# Patient Record
Sex: Female | Born: 1945 | Race: White | Hispanic: No | Marital: Married | Smoking: Never smoker
Health system: Southern US, Community
[De-identification: ages and names within clinical notes are randomized; demographics above are authoritative.]

## PROBLEM LIST (undated history)

## (undated) DIAGNOSIS — E7800 Pure hypercholesterolemia, unspecified: Secondary | ICD-10-CM

## (undated) DIAGNOSIS — E119 Type 2 diabetes mellitus without complications: Secondary | ICD-10-CM

## (undated) DIAGNOSIS — I10 Essential (primary) hypertension: Secondary | ICD-10-CM

## (undated) HISTORY — DX: Pure hypercholesterolemia, unspecified: E78.00

## (undated) HISTORY — PX: NO PAST SURGERIES: SHX2092

## (undated) HISTORY — DX: Essential (primary) hypertension: I10

## (undated) HISTORY — DX: Type 2 diabetes mellitus without complications: E11.9

## (undated) HISTORY — PX: ABDOMINAL HYSTERECTOMY: SHX81

---

## 2006-04-25 ENCOUNTER — Ambulatory Visit: Payer: Self-pay | Admitting: Internal Medicine

## 2006-05-09 ENCOUNTER — Ambulatory Visit: Payer: Self-pay | Admitting: Internal Medicine

## 2011-05-31 ENCOUNTER — Encounter: Payer: Self-pay | Admitting: Internal Medicine

## 2012-06-08 ENCOUNTER — Encounter: Payer: Self-pay | Admitting: Internal Medicine

## 2014-05-02 ENCOUNTER — Encounter: Payer: Self-pay | Admitting: Internal Medicine

## 2014-05-08 ENCOUNTER — Ambulatory Visit (AMBULATORY_SURGERY_CENTER): Payer: Medicare Other

## 2014-05-08 VITALS — Ht 60.0 in | Wt 153.8 lb

## 2014-05-08 DIAGNOSIS — Z800 Family history of malignant neoplasm of digestive organs: Secondary | ICD-10-CM

## 2014-05-08 MED ORDER — MOVIPREP 100 G PO SOLR: 1.0000 | Freq: Once | ORAL | Status: DC

## 2014-05-08 NOTE — Progress Notes (Signed)
No allergies to eggs or soy No diet/weight loss meds No home oxygen No past problems with anesthesia  Has email  Emmi instructions given for colonoscopy 

## 2014-05-22 ENCOUNTER — Ambulatory Visit (AMBULATORY_SURGERY_CENTER): Payer: Medicare Other | Admitting: Internal Medicine

## 2014-05-22 ENCOUNTER — Encounter: Payer: Self-pay | Admitting: Internal Medicine

## 2014-05-22 VITALS — BP 123/59 | HR 67 | Temp 96.4°F | Resp 16 | Ht 60.0 in | Wt 153.0 lb

## 2014-05-22 DIAGNOSIS — Z800 Family history of malignant neoplasm of digestive organs: Secondary | ICD-10-CM

## 2014-05-22 DIAGNOSIS — Z1211 Encounter for screening for malignant neoplasm of colon: Secondary | ICD-10-CM

## 2014-05-22 DIAGNOSIS — D126 Benign neoplasm of colon, unspecified: Secondary | ICD-10-CM

## 2014-05-22 DIAGNOSIS — Z86010 Personal history of colonic polyps: Secondary | ICD-10-CM

## 2014-05-22 MED ORDER — SODIUM CHLORIDE 0.9 % IV SOLN: 500.0000 mL | INTRAVENOUS | Status: DC

## 2014-05-22 NOTE — Progress Notes (Signed)
Called to room to assist during endoscopic procedure.  Patient ID and intended procedure confirmed with present staff. Received instructions for my participation in the procedure from the performing physician.  

## 2014-05-22 NOTE — Op Note (Signed)
[place]  [address]. [place], [id number]   COLONOSCOPY PROCEDURE REPORT  PATIENT: Linda Booker, Linda Booker  MR#: [id number] BIRTHDATE: 05/29/1946 , 20  yrs. old GENDER: Female ENDOSCOPIST: Lafayette Dragon, MD REFERRED JS:HFWY Virgina Jock, M.D. PROCEDURE DATE:  05/22/2014 PROCEDURE:   Colonoscopy with cold biopsy polypectomy First Screening Colonoscopy - Avg.  risk and is 50 yrs.  old or older - No.  Prior Negative Screening - Now for repeat screening. N/A  History of Adenoma - Now for follow-up colonoscopy & has been > or = to 3 yrs.  Yes hx of adenoma.  Has been 3 or more years since last colonoscopy.  Polyps Removed Today? Yes. ASA CLASS:   Class II INDICATIONS:Patient's immediate family history of colon cancer and prior colonoscopy in August 2007..  Tubular adenoma and hyperplastic polyp removed from sigmoid colon.  Father with colon cancer. MEDICATIONS: MAC sedation, administered by CRNA and Propofol (Diprivan) 270 mg IV  DESCRIPTION OF PROCEDURE:   After the risks benefits and alternatives of the procedure were thoroughly explained, informed consent was obtained.  A digital rectal exam revealed no abnormalities of the rectum.   The LB [id number]  endoscope was introduced through the anus and advanced to the cecum, which was identified by both the appendix and ileocecal valve. No adverse events experienced.   The quality of the prep was Moviprep fair The instrument was then slowly withdrawn as the colon was fully examined.      COLON FINDINGS: Two sessile polyps ranging between 3-17mm in size were found in the sigmoid colon and descending colon.  A polypectomy was performed with cold forceps.  The resection was complete and the polyp tissue was completely retrieved.   Mild diverticulosis was noted in the descending colon.  Retroflexed views revealed no abnormalities. The time to cecum=10 minutes 43 seconds.  Withdrawal time=8 minutes 59 seconds.  The  scope was withdrawn and the procedure completed. COMPLICATIONS: There were no complications.  ENDOSCOPIC IMPRESSION: 1.   Two sessile polyps ranging between 3-44mm in size were found in the sigmoid colon and descending colon; polypectomy was performed with cold forceps 2.   Mild diverticulosis was noted in the descending colon  RECOMMENDATIONS: 1.  Await pathology results 2.  high fiber diet Recall colonoscopy pending path report   eSigned:  Lafayette Dragon, MD 05/22/2014 11:54 AM   cc:   PATIENT NAME:  Linda Booker MR#: [id number]

## 2014-05-22 NOTE — Patient Instructions (Signed)
YOU HAD AN ENDOSCOPIC PROCEDURE TODAY AT THE [facility]: Refer to the procedure report that was given to you for any specific questions about what was found during the examination.  If the procedure report does not answer your questions, please call your gastroenterologist to clarify.  If you requested that your care partner not be given the details of your procedure findings, then the procedure report has been included in a sealed envelope for you to review at your convenience later.  YOU SHOULD EXPECT: Some feelings of bloating in the abdomen. Passage of more gas than usual.  Walking can help get rid of the air that was put into your GI tract during the procedure and reduce the bloating. If you had a lower endoscopy (such as a colonoscopy or flexible sigmoidoscopy) you may notice spotting of blood in your stool or on the toilet paper. If you underwent a bowel prep for your procedure, then you may not have a normal bowel movement for a few days.  DIET: Your first meal following the procedure should be a light meal and then it is ok to progress to your normal diet.  A half-sandwich or bowl of soup is an example of a good first meal.  Heavy or fried foods are harder to digest and may make you feel nauseous or bloated.  Likewise meals heavy in dairy and vegetables can cause extra gas to form and this can also increase the bloating.  Drink plenty of fluids but you should avoid alcoholic beverages for 24 hours.  ACTIVITY: Your care partner should take you home directly after the procedure.  You should plan to take it easy, moving slowly for the rest of the day.  You can resume normal activity the day after the procedure however you should NOT DRIVE or use heavy machinery for 24 hours (because of the sedation medicines used during the test).    SYMPTOMS TO REPORT IMMEDIATELY: A gastroenterologist can be reached at any hour.  During normal business hours, 8:30 AM to 5:00 PM Monday through Friday,  call [phone].  After hours and on weekends, please call the GI answering service at [phone] who will take a message and have the physician on call contact you.   Following lower endoscopy (colonoscopy or flexible sigmoidoscopy):  Excessive amounts of blood in the stool  Significant tenderness or worsening of abdominal pains  Swelling of the abdomen that is new, acute  Fever of 100F or higher  FOLLOW UP: If any biopsies were taken you will be contacted by phone or by letter within the next 1-3 weeks.  Call your gastroenterologist if you have not heard about the biopsies in 3 weeks.  Our staff will call the home number listed on your records the next business day following your procedure to check on you and address any questions or concerns that you may have at that time regarding the information given to you following your procedure. This is a courtesy call and so if there is no answer at the home number and we have not heard from you through the emergency physician on call, we will assume that you have returned to your regular daily activities without incident.  SIGNATURES/CONFIDENTIALITY: You and/or your care partner have signed paperwork which will be entered into your electronic medical record.  These signatures attest to the fact that that the information above on your After Visit Summary has been reviewed and is understood.  Full responsibility of the confidentiality of this   discharge information lies with you and/or your care-partner.  Polyps, diverticulosis, high fiber diet-handouts given  Repeat colonoscopy will be detemined by pathology.

## 2014-05-22 NOTE — Progress Notes (Signed)
Procedure ends, to recovery, report given and VSS. 

## 2014-05-23 ENCOUNTER — Telehealth: Payer: Self-pay | Admitting: *Deleted

## 2014-05-23 NOTE — Telephone Encounter (Signed)
  Follow up Call-  Call back number 05/22/2014  Post procedure Call Back phone  # [phone]  Permission to leave phone message Yes     Patient questions:  Do you have a fever, pain , or abdominal swelling? No. Pain Score  0 *  Have you tolerated food without any problems? Yes.    Have you been able to return to your normal activities? Yes.    Do you have any questions about your discharge instructions: Diet   No. Medications  No. Follow up visit  No.  Do you have questions or concerns about your Care? No.  Actions: * If pain score is 4 or above: No action needed, pain <4.

## 2014-05-28 ENCOUNTER — Other Ambulatory Visit: Payer: Self-pay | Admitting: Internal Medicine

## 2014-05-28 ENCOUNTER — Encounter: Payer: Self-pay | Admitting: Internal Medicine

## 2014-05-28 DIAGNOSIS — Z1231 Encounter for screening mammogram for malignant neoplasm of breast: Secondary | ICD-10-CM

## 2014-05-29 ENCOUNTER — Encounter: Payer: Self-pay | Admitting: *Deleted

## 2014-06-10 ENCOUNTER — Ambulatory Visit: Payer: Medicare Other

## 2014-06-10 ENCOUNTER — Ambulatory Visit
Admission: RE | Admit: 2014-06-10 | Discharge: 2014-06-10 | Disposition: A | Discharge status: Home / Self Care | Payer: 59 | Source: Ambulatory Visit | Attending: Internal Medicine | Admitting: Internal Medicine

## 2014-06-10 ENCOUNTER — Encounter (INDEPENDENT_AMBULATORY_CARE_PROVIDER_SITE_OTHER): Payer: Self-pay

## 2014-06-10 DIAGNOSIS — Z1231 Encounter for screening mammogram for malignant neoplasm of breast: Secondary | ICD-10-CM

## 2015-05-12 ENCOUNTER — Other Ambulatory Visit: Payer: Self-pay

## 2015-05-12 DIAGNOSIS — Z1231 Encounter for screening mammogram for malignant neoplasm of breast: Secondary | ICD-10-CM

## 2015-06-12 ENCOUNTER — Ambulatory Visit: Payer: Self-pay

## 2015-06-17 ENCOUNTER — Ambulatory Visit
Admission: RE | Admit: 2015-06-17 | Discharge: 2015-06-17 | Disposition: A | Discharge status: Home / Self Care | Payer: Medicare Other | Source: Ambulatory Visit

## 2015-06-17 DIAGNOSIS — Z1231 Encounter for screening mammogram for malignant neoplasm of breast: Secondary | ICD-10-CM

## 2018-01-25 ENCOUNTER — Other Ambulatory Visit: Payer: Self-pay | Admitting: Internal Medicine

## 2018-01-25 DIAGNOSIS — Z1231 Encounter for screening mammogram for malignant neoplasm of breast: Secondary | ICD-10-CM

## 2018-02-24 ENCOUNTER — Ambulatory Visit
Admission: RE | Admit: 2018-02-24 | Discharge: 2018-02-24 | Disposition: A | Discharge status: Home / Self Care | Payer: Medicare Other | Source: Ambulatory Visit | Attending: Internal Medicine | Admitting: Internal Medicine

## 2018-02-24 DIAGNOSIS — Z1231 Encounter for screening mammogram for malignant neoplasm of breast: Secondary | ICD-10-CM

## 2019-05-04 ENCOUNTER — Encounter: Payer: Self-pay | Admitting: Gastroenterology

## 2019-10-10 ENCOUNTER — Ambulatory Visit: Payer: Medicare PPO | Attending: Internal Medicine

## 2019-10-10 DIAGNOSIS — Z23 Encounter for immunization: Secondary | ICD-10-CM | POA: Insufficient documentation

## 2019-10-10 NOTE — Progress Notes (Signed)
   Covid-19 Vaccination Clinic  Name:  Linda Booker    MRN: [id number] DOB: Jul 21, 1946  10/10/2019  [name] was observed post Covid-19 immunization for 15 minutes without incidence. She was provided with Vaccine Information Sheet and instruction to access the V-Safe system.   [name] was instructed to call 911 with any severe reactions post vaccine: Marland Kitchen Difficulty breathing  . Swelling of your face and throat  . A fast heartbeat  . A bad rash all over your body  . Dizziness and weakness    Immunizations Administered    Name Date Dose VIS Date Route   Pfizer COVID-19 Vaccine 10/10/2019  1:59 PM 0.3 mL 08/31/2019 Intramuscular   Manufacturer: [place]   Lot: [id number]   [place]: [id number]

## 2019-10-31 ENCOUNTER — Ambulatory Visit: Payer: Medicare PPO | Attending: Internal Medicine

## 2019-10-31 DIAGNOSIS — Z23 Encounter for immunization: Secondary | ICD-10-CM | POA: Insufficient documentation

## 2019-10-31 NOTE — Progress Notes (Signed)
   Covid-19 Vaccination Clinic  Name:  Linda Booker    MRN: [id number] DOB: 11-Sep-1946  10/31/2019  [name] was observed post Covid-19 immunization for 15 minutes without incidence. She was provided with Vaccine Information Sheet and instruction to access the V-Safe system.   [name] was instructed to call 911 with any severe reactions post vaccine: Marland Kitchen Difficulty breathing  . Swelling of your face and throat  . A fast heartbeat  . A bad rash all over your body  . Dizziness and weakness    Immunizations Administered    Name Date Dose VIS Date Route   Pfizer COVID-19 Vaccine 10/31/2019  1:51 PM 0.3 mL 08/31/2019 Intramuscular   Manufacturer: [place]   Lot: [id number]   [place]: [id number]

## 2019-11-12 ENCOUNTER — Ambulatory Visit: Payer: Medicare Other

## 2020-02-12 DIAGNOSIS — M859 Disorder of bone density and structure, unspecified: Secondary | ICD-10-CM | POA: Diagnosis not present

## 2020-02-12 DIAGNOSIS — M109 Gout, unspecified: Secondary | ICD-10-CM | POA: Diagnosis not present

## 2020-02-12 DIAGNOSIS — E7849 Other hyperlipidemia: Secondary | ICD-10-CM | POA: Diagnosis not present

## 2020-02-12 DIAGNOSIS — Z0000 Encounter for general adult medical examination without abnormal findings: Secondary | ICD-10-CM | POA: Diagnosis not present

## 2020-02-12 DIAGNOSIS — E1129 Type 2 diabetes mellitus with other diabetic kidney complication: Secondary | ICD-10-CM | POA: Diagnosis not present

## 2020-02-22 DIAGNOSIS — Z794 Long term (current) use of insulin: Secondary | ICD-10-CM | POA: Diagnosis not present

## 2020-02-22 DIAGNOSIS — Z1331 Encounter for screening for depression: Secondary | ICD-10-CM | POA: Diagnosis not present

## 2020-02-22 DIAGNOSIS — N184 Chronic kidney disease, stage 4 (severe): Secondary | ICD-10-CM | POA: Diagnosis not present

## 2020-02-22 DIAGNOSIS — R809 Proteinuria, unspecified: Secondary | ICD-10-CM | POA: Diagnosis not present

## 2020-02-22 DIAGNOSIS — Z0000 Encounter for general adult medical examination without abnormal findings: Secondary | ICD-10-CM | POA: Diagnosis not present

## 2020-02-22 DIAGNOSIS — E1129 Type 2 diabetes mellitus with other diabetic kidney complication: Secondary | ICD-10-CM | POA: Diagnosis not present

## 2020-02-22 DIAGNOSIS — E11319 Type 2 diabetes mellitus with unspecified diabetic retinopathy without macular edema: Secondary | ICD-10-CM | POA: Diagnosis not present

## 2020-02-22 DIAGNOSIS — E785 Hyperlipidemia, unspecified: Secondary | ICD-10-CM | POA: Diagnosis not present

## 2020-02-22 DIAGNOSIS — H3560 Retinal hemorrhage, unspecified eye: Secondary | ICD-10-CM | POA: Diagnosis not present

## 2020-02-22 DIAGNOSIS — I129 Hypertensive chronic kidney disease with stage 1 through stage 4 chronic kidney disease, or unspecified chronic kidney disease: Secondary | ICD-10-CM | POA: Diagnosis not present

## 2020-03-11 ENCOUNTER — Ambulatory Visit (INDEPENDENT_AMBULATORY_CARE_PROVIDER_SITE_OTHER): Payer: Medicare PPO | Admitting: Ophthalmology

## 2020-03-11 ENCOUNTER — Encounter (INDEPENDENT_AMBULATORY_CARE_PROVIDER_SITE_OTHER): Payer: Medicare PPO | Admitting: Ophthalmology

## 2020-03-11 ENCOUNTER — Other Ambulatory Visit: Payer: Self-pay

## 2020-03-11 ENCOUNTER — Encounter (INDEPENDENT_AMBULATORY_CARE_PROVIDER_SITE_OTHER): Payer: Self-pay | Admitting: Ophthalmology

## 2020-03-11 DIAGNOSIS — D3131 Benign neoplasm of right choroid: Secondary | ICD-10-CM | POA: Insufficient documentation

## 2020-03-11 DIAGNOSIS — E113551 Type 2 diabetes mellitus with stable proliferative diabetic retinopathy, right eye: Secondary | ICD-10-CM | POA: Diagnosis not present

## 2020-03-11 DIAGNOSIS — E113412 Type 2 diabetes mellitus with severe nonproliferative diabetic retinopathy with macular edema, left eye: Secondary | ICD-10-CM | POA: Diagnosis not present

## 2020-03-11 DIAGNOSIS — E113511 Type 2 diabetes mellitus with proliferative diabetic retinopathy with macular edema, right eye: Secondary | ICD-10-CM | POA: Insufficient documentation

## 2020-03-11 DIAGNOSIS — H4311 Vitreous hemorrhage, right eye: Secondary | ICD-10-CM | POA: Diagnosis not present

## 2020-03-11 NOTE — Assessment & Plan Note (Signed)
Good peripheral PRP to areas of ischemia, no active disease observed

## 2020-03-11 NOTE — Progress Notes (Signed)
03/11/2020     CHIEF COMPLAINT Patient presents for Diabetic Eye Exam   HISTORY OF PRESENT ILLNESS: Linda Booker is a 74 y.o. female who presents to the clinic today for:   HPI    Diabetic Eye Exam    Vision is stable.  Associated Symptoms Negative for Flashes and Floaters.  Diabetes characteristics include Type 2.  Blood sugar level is controlled.  Last A1C 7.5.  I, the attending physician,  performed the HPI with the patient and updated documentation appropriately.          Comments    3 Month Diabetic Macular Edema f\u OU. OCT  Pt states vision is stable. Denies FOL and floaters.       Last edited by Tilda Franco on 03/11/2020  9:14 AM. (History)      Referring physician: Shon Baton, MD [address] [place],  [place] [address]  HISTORICAL INFORMATION:   Selected notes from the [facility]: No current outpatient medications on file. (Ophthalmic Drugs)   No current facility-administered medications for this visit. (Ophthalmic Drugs)   Current Outpatient Medications (Other)  Medication Sig  . amLODipine (NORVASC) 2.5 MG tablet Take 2.5 mg by mouth daily.  Marland Kitchen atorvastatin (LIPITOR) 40 MG tablet Take 40 mg by mouth daily.  Marland Kitchen glimepiride (AMARYL) 4 MG tablet Take 4 mg by mouth daily with breakfast.  . hydrochlorothiazide (HYDRODIURIL) 25 MG tablet Take 25 mg by mouth daily.  . insulin glargine (LANTUS) 100 UNIT/ML injection Inject 15 Units into the skin at bedtime.  . sitaGLIPtin (JANUVIA) 100 MG tablet Take 100 mg by mouth daily.   No current facility-administered medications for this visit. (Other)      REVIEW OF SYSTEMS:    ALLERGIES No Known Allergies  PAST MEDICAL HISTORY Past Medical History:  Diagnosis Date  . Diabetes mellitus, type 2 ([place])   . Hypercholesteremia   . Hypertension    Past Surgical History:  Procedure Laterality Date  . NO PAST SURGERIES      FAMILY HISTORY Family History    Problem Relation Age of Onset  . Colon cancer Father   . Esophageal cancer Neg Hx   . Stomach cancer Neg Hx   . Rectal cancer Neg Hx     SOCIAL HISTORY Social History   Tobacco Use  . Smoking status: Never Smoker  . Smokeless tobacco: Never Used  Substance Use Topics  . Alcohol use: No  . Drug use: No         OPHTHALMIC EXAM:  Base Eye Exam    Visual Acuity (Snellen - Linear)      Right Left   Dist [place] 20/25 20/20       Tonometry (Tonopen, 9:17 AM)      Right Left   Pressure 17 15       Pupils      Pupils Dark Light Shape React APD   Right PERRL 3 2 Round Brisk None   Left PERRL 3 2 Round Brisk None       Visual Fields (Counting fingers)      Left Right    Full Full       Neuro/Psych    Oriented x3: Yes   Mood/Affect: Normal       Dilation    Both eyes: 1.0% Mydriacyl, 2.5% Phenylephrine @ 9:17 AM        Slit Lamp and Fundus Exam  External Exam      Right Left   External Normal Normal       Slit Lamp Exam      Right Left   Lids/Lashes Normal Normal   Conjunctiva/Sclera White and quiet White and quiet   Cornea Clear Clear   Anterior Chamber Deep and quiet Deep and quiet   Iris Round and reactive Round and reactive   Lens Centered posterior chamber intraocular lens Centered posterior chamber intraocular lens   Anterior Vitreous Normal Normal       Fundus Exam      Right Left   Posterior Vitreous Normal Normal   Disc Normal Normal   C/D Ratio 0.25 0.25   Macula Microaneurysms, Exudates, no macular thickening Normal   Vessels PDR-quiet NPDR-Severe   Periphery 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 Scan locations included subfoveal. Central Foveal  Thickness: 293. Progression has been stable. Findings include abnormal foveal contour.   Left Eye Central Foveal Thickness: 288. Progression has been stable. Findings include abnormal foveal contour.   Notes Minor focal thickening superior, temporal and nasal to the foveal avascular zone OD, although overall improved from 9 months previous.  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 Vitreous hemorrhage of right eye ([place])  H43.11   4. 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  2.  Severe NPDR OS now stable status post peripheral anterior PRP  3.  OD quiet, with CSME slowly improving.  Ophthalmic Meds Ordered this visit:  No orders of the defined types were placed in this encounter.      Return in about 5 months (around 08/11/2020) for DILATE OU, COLOR FP.  There are no Patient Instructions on file for this visit.   Explained the diagnoses, plan, and follow up with the patient and they expressed understanding.  Patient expressed understanding of the importance of proper follow up care.   Clent Demark Kaelynn Igo M.D. Diseases & Surgery  of the Retina and Vitreous Retina & Diabetic [place] 03/11/20     Abbreviations: M myopia (nearsighted); A astigmatism; H hyperopia (farsighted); P presbyopia; Mrx spectacle prescription;  CTL contact lenses; OD right eye; OS left eye; OU both eyes  XT exotropia; ET esotropia;  PEK punctate epithelial keratitis; PEE punctate epithelial erosions; DES dry eye syndrome; MGD meibomian gland dysfunction; ATs artificial tears; PFAT's preservative free artificial tears; [place] nuclear sclerotic cataract; PSC posterior subcapsular cataract; ERM epi-retinal membrane; PVD posterior vitreous detachment; RD retinal detachment; DM diabetes mellitus; DR diabetic retinopathy; NPDR non-proliferative diabetic retinopathy; PDR proliferative diabetic retinopathy; CSME clinically significant macular edema; DME diabetic macular edema; dbh dot blot hemorrhages; CWS cotton wool spot; POAG primary open angle glaucoma; C/D cup-to-disc ratio; HVF humphrey visual field; GVF goldmann visual field; OCT optical coherence tomography; IOP intraocular pressure; BRVO Branch retinal vein occlusion; CRVO central retinal vein occlusion; CRAO central retinal artery occlusion; BRAO branch retinal artery occlusion; RT retinal tear; SB scleral buckle; PPV pars plana vitrectomy; VH Vitreous hemorrhage; PRP panretinal laser photocoagulation; IVK intravitreal kenalog; VMT vitreomacular traction; MH Macular hole;  NVD neovascularization of the disc; NVE neovascularization elsewhere; AREDS age related eye disease study; ARMD age related macular degeneration; POAG primary open angle glaucoma; EBMD epithelial/anterior basement membrane dystrophy; ACIOL anterior chamber intraocular lens; IOL intraocular lens; PCIOL posterior chamber intraocular lens; Phaco/IOL phacoemulsification with intraocular lens placement; [place] photorefractive keratectomy; LASIK laser assisted in situ keratomileusis; HTN hypertension; DM diabetes mellitus; COPD chronic obstructive  pulmonary disease

## 2020-03-11 NOTE — Assessment & Plan Note (Signed)
Much less active, status post PRP, CSME also improving.

## 2020-03-26 DIAGNOSIS — Z111 Encounter for screening for respiratory tuberculosis: Secondary | ICD-10-CM | POA: Diagnosis not present

## 2020-03-28 DIAGNOSIS — I129 Hypertensive chronic kidney disease with stage 1 through stage 4 chronic kidney disease, or unspecified chronic kidney disease: Secondary | ICD-10-CM | POA: Diagnosis not present

## 2020-03-28 DIAGNOSIS — L0889 Other specified local infections of the skin and subcutaneous tissue: Secondary | ICD-10-CM | POA: Diagnosis not present

## 2020-03-28 DIAGNOSIS — E1129 Type 2 diabetes mellitus with other diabetic kidney complication: Secondary | ICD-10-CM | POA: Diagnosis not present

## 2020-03-28 DIAGNOSIS — N184 Chronic kidney disease, stage 4 (severe): Secondary | ICD-10-CM | POA: Diagnosis not present

## 2020-03-31 DIAGNOSIS — S60420A Blister (nonthermal) of right index finger, initial encounter: Secondary | ICD-10-CM | POA: Diagnosis not present

## 2020-03-31 DIAGNOSIS — M795 Residual foreign body in soft tissue: Secondary | ICD-10-CM | POA: Diagnosis not present

## 2020-08-11 ENCOUNTER — Encounter (INDEPENDENT_AMBULATORY_CARE_PROVIDER_SITE_OTHER): Payer: Self-pay | Admitting: Ophthalmology

## 2020-08-11 ENCOUNTER — Ambulatory Visit (INDEPENDENT_AMBULATORY_CARE_PROVIDER_SITE_OTHER): Payer: Medicare PPO | Admitting: Ophthalmology

## 2020-08-11 ENCOUNTER — Other Ambulatory Visit: Payer: Self-pay

## 2020-08-11 DIAGNOSIS — E113412 Type 2 diabetes mellitus with severe nonproliferative diabetic retinopathy with macular edema, left eye: Secondary | ICD-10-CM | POA: Diagnosis not present

## 2020-08-11 DIAGNOSIS — D3131 Benign neoplasm of right choroid: Secondary | ICD-10-CM | POA: Diagnosis not present

## 2020-08-11 DIAGNOSIS — E113511 Type 2 diabetes mellitus with proliferative diabetic retinopathy with macular edema, right eye: Secondary | ICD-10-CM

## 2020-08-11 DIAGNOSIS — E113551 Type 2 diabetes mellitus with stable proliferative diabetic retinopathy, right eye: Secondary | ICD-10-CM

## 2020-08-11 NOTE — Assessment & Plan Note (Signed)
Stable no change

## 2020-08-11 NOTE — Assessment & Plan Note (Signed)
No active maculopathy, severe nonproliferative diabetic retinopathy stabilized with peripheral anterior PRP in the past

## 2020-08-11 NOTE — Progress Notes (Signed)
08/11/2020     CHIEF COMPLAINT Patient presents for Retina Follow Up   HISTORY OF PRESENT ILLNESS: Linda Booker is a 74 y.o. female who presents to the clinic today for:   HPI    Retina Follow Up    Patient presents with  Diabetic Retinopathy.  In both eyes.  This started 5 months ago.  Severity is mild.  Duration of 5 months.  Since onset it is stable.          Comments    5 Month F/U OU  Pt denies noticeable changes to [place] OU since last visit. Pt denies ocular pain, flashes of light, or floaters OU.  A1c: 7.0, 02/2020 LBS: 120 yesterday       Last edited by Rockie Neighbours, [place] on 08/11/2020  9:13 AM. (History)      Referring physician: Shon Baton, MD [place],  [place] [address]  HISTORICAL INFORMATION:   Selected notes from the [facility]: No current outpatient medications on file. (Ophthalmic Drugs)   No current facility-administered medications for this visit. (Ophthalmic Drugs)   Current Outpatient Medications (Other)  Medication Sig  . amLODipine (NORVASC) 2.5 MG tablet Take 2.5 mg by mouth daily.  Marland Kitchen atorvastatin (LIPITOR) 40 MG tablet Take 40 mg by mouth daily.  Marland Kitchen glimepiride (AMARYL) 4 MG tablet Take 4 mg by mouth daily with breakfast.  . hydrochlorothiazide (HYDRODIURIL) 25 MG tablet Take 25 mg by mouth daily.  . insulin glargine (LANTUS) 100 UNIT/ML injection Inject 15 Units into the skin at bedtime.  . sitaGLIPtin (JANUVIA) 100 MG tablet Take 100 mg by mouth daily.   No current facility-administered medications for this visit. (Other)      REVIEW OF SYSTEMS:    ALLERGIES No Known Allergies  PAST MEDICAL HISTORY Past Medical History:  Diagnosis Date  . Diabetes mellitus, type 2 ([place])   . Hypercholesteremia   . Hypertension    Past Surgical History:  Procedure Laterality Date  . NO PAST SURGERIES      FAMILY HISTORY Family History  Problem Relation Age of Onset  . Colon  cancer Father   . Esophageal cancer Neg Hx   . Stomach cancer Neg Hx   . Rectal cancer Neg Hx     SOCIAL HISTORY Social History   Tobacco Use  . Smoking status: Never Smoker  . Smokeless tobacco: Never Used  Substance Use Topics  . Alcohol use: No  . Drug use: No         OPHTHALMIC EXAM:  Base Eye Exam    Visual Acuity (ETDRS)      Right Left   Dist [place] 20/25 20/20       Tonometry (Tonopen, 9:13 AM)      Right Left   Pressure 13 17       Pupils      Pupils Dark Light Shape React APD   Right PERRL 3 2 Round Brisk None   Left PERRL 3 2 Round Brisk None       Visual Fields (Counting fingers)      Left Right    Full Full       Extraocular Movement      Right Left    Full Full       Neuro/Psych    Oriented x3: Yes   Mood/Affect: Normal       Dilation    Both eyes: 1.0% Mydriacyl, 2.5%  Phenylephrine @ 9:15 AM        Slit Lamp and Fundus Exam    External Exam      Right Left   External Normal Normal       Slit Lamp Exam      Right Left   Lids/Lashes Normal Normal   Conjunctiva/Sclera White and quiet White and quiet   Cornea Clear Clear   Anterior Chamber Deep and quiet Deep and quiet   Iris Round and reactive Round and reactive   Lens Centered posterior chamber intraocular lens Centered posterior chamber intraocular lens   Anterior Vitreous Normal Normal       Fundus Exam      Right Left   Posterior Vitreous Normal Normal   Disc Normal Normal   C/D Ratio 0.25 0.25   Macula Microaneurysms, new Exudates, new  macular thickening Microaneurysms, no clinically significant macular edema   Vessels PDR-quiet NPDR-Severe,    Periphery Choroidal nevus small superior to the optic nerve, 1.0 disc diameter in size, flat, regular pigment, no atrophy, and no lipofuscin and no red, good PRP 360 Good PRP 360 anterior retina          IMAGING AND PROCEDURES  Imaging and Procedures for 08/11/20  OCT, Retina - OU - Both Eyes       Right Eye Quality  was good. Scan locations included subfoveal. Central Foveal Thickness: 293. Progression has worsened. Findings include abnormal foveal contour.   Left Eye Quality was good. Scan locations included subfoveal. Central Foveal Thickness: 292. Progression has been stable. Findings include normal observations.   Notes CSME nasal and superotemporal and inferotemporal to the fovea OD are new and worse, will need focal laser treatment to these areas  We will schedule OD soon       Color Fundus Photography Optos - OU - Both Eyes       Right Eye Progression has improved. Disc findings include normal observations. Macula : exudates, microaneurysms.   Left Eye Progression has been stable. Disc findings include normal observations. Macula : microaneurysms.   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 Controlled type 2 diabetes mellitus with left eye affected by severe nonproliferative retinopathy and macular edema, unspecified whether long term insulin use (HCC)  M38.4665 OCT, Retina - OU - Both Eyes    Color Fundus Photography Optos  - OU - Both Eyes  3. 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  2.  3.  Ophthalmic Meds Ordered this visit:  No orders of the defined types were placed in this encounter.      Return in about 2 weeks (around 08/25/2020) for DILATE OU, OPTOS FFA R/L, COLOR FP, FOCAL, OD.  There are no Patient Instructions on file for this visit.   Explained the diagnoses, plan, and follow up with the patient and they expressed understanding.  Patient expressed understanding of the importance of proper follow up care.   Clent Demark Devron Cohick M.D. Diseases & Surgery of the Retina and Vitreous Retina & Diabetic [place] 08/11/20     Abbreviations: M myopia (nearsighted); A astigmatism; H hyperopia (farsighted); P presbyopia; Mrx spectacle prescription;  CTL contact lenses; OD right eye; OS left eye; OU both eyes  XT exotropia; ET esotropia; PEK punctate epithelial keratitis; PEE punctate epithelial erosions; DES dry eye syndrome; MGD meibomian gland dysfunction; ATs artificial tears; PFAT's preservative free artificial tears; [place] nuclear sclerotic cataract; PSC posterior subcapsular cataract; ERM epi-retinal membrane; PVD posterior vitreous detachment; RD retinal detachment; DM diabetes mellitus; DR diabetic retinopathy; NPDR non-proliferative diabetic retinopathy; PDR proliferative diabetic retinopathy; CSME clinically significant macular edema; DME diabetic macular edema; dbh dot blot hemorrhages; CWS cotton wool spot; POAG primary open angle glaucoma; C/D cup-to-disc ratio; HVF humphrey visual field; GVF goldmann visual field; OCT optical coherence tomography; IOP intraocular pressure; BRVO Branch retinal vein occlusion; CRVO central retinal vein occlusion; CRAO central retinal artery occlusion; BRAO branch retinal artery occlusion; RT retinal tear; SB scleral buckle; PPV  pars plana vitrectomy; VH Vitreous hemorrhage; PRP panretinal laser photocoagulation; IVK intravitreal kenalog; VMT vitreomacular traction; MH Macular hole;  NVD neovascularization of the disc; NVE neovascularization elsewhere; AREDS age related eye disease study; ARMD age related macular degeneration; POAG primary open angle glaucoma; EBMD epithelial/anterior basement membrane dystrophy; ACIOL anterior chamber intraocular lens; IOL intraocular lens; PCIOL posterior chamber intraocular lens; Phaco/IOL phacoemulsification with intraocular lens placement; [place] photorefractive keratectomy; LASIK laser assisted in situ keratomileusis; HTN hypertension; DM diabetes mellitus; COPD chronic obstructive pulmonary disease

## 2020-08-11 NOTE — Assessment & Plan Note (Signed)
OD, new findings will need focal laser treatment OD in the near future to limit progression

## 2020-08-18 DIAGNOSIS — L281 Prurigo nodularis: Secondary | ICD-10-CM | POA: Diagnosis not present

## 2020-08-18 DIAGNOSIS — B353 Tinea pedis: Secondary | ICD-10-CM | POA: Diagnosis not present

## 2020-08-18 DIAGNOSIS — L280 Lichen simplex chronicus: Secondary | ICD-10-CM | POA: Diagnosis not present

## 2020-08-18 DIAGNOSIS — B354 Tinea corporis: Secondary | ICD-10-CM | POA: Diagnosis not present

## 2020-08-26 ENCOUNTER — Encounter (INDEPENDENT_AMBULATORY_CARE_PROVIDER_SITE_OTHER): Payer: Self-pay | Admitting: Ophthalmology

## 2020-08-26 ENCOUNTER — Ambulatory Visit (INDEPENDENT_AMBULATORY_CARE_PROVIDER_SITE_OTHER): Payer: Medicare PPO | Admitting: Ophthalmology

## 2020-08-26 ENCOUNTER — Other Ambulatory Visit: Payer: Self-pay

## 2020-08-26 DIAGNOSIS — E113412 Type 2 diabetes mellitus with severe nonproliferative diabetic retinopathy with macular edema, left eye: Secondary | ICD-10-CM

## 2020-08-26 DIAGNOSIS — E113551 Type 2 diabetes mellitus with stable proliferative diabetic retinopathy, right eye: Secondary | ICD-10-CM

## 2020-08-26 DIAGNOSIS — E113511 Type 2 diabetes mellitus with proliferative diabetic retinopathy with macular edema, right eye: Secondary | ICD-10-CM

## 2020-08-26 MED ORDER — FLUORESCEIN SODIUM 10 % IV SOLN
500.0000 mg | INTRAVENOUS | Status: AC | PRN
Start: 2020-08-26 — End: 2020-08-26
  Administered 2020-08-26: 500 mg via INTRAVENOUS

## 2020-08-26 NOTE — Progress Notes (Signed)
08/26/2020     CHIEF COMPLAINT Patient presents for Retina Follow Up   HISTORY OF PRESENT ILLNESS: Linda Booker is a 74 y.o. female who presents to the clinic today for:   HPI    Retina Follow Up    Patient presents with  Diabetic Retinopathy.  In right eye.  This started 2 weeks ago.  Severity is mild.  Duration of 2 weeks.  Since onset it is stable.          Comments    2 Week FFA/FP R/L, Focal OD  Pt denies noticeable changes to [place] OU since last visit. Pt denies ocular pain, flashes of light, or floaters OU.  LBS: did not check       Last edited by Rockie Neighbours, [place] on 08/26/2020 10:38 AM. (History)      Referring physician: Shon Baton, MD [address] [place],  [place] [address]  HISTORICAL INFORMATION:   Selected notes from the [facility]: No current outpatient medications on file. (Ophthalmic Drugs)   No current facility-administered medications for this visit. (Ophthalmic Drugs)   Current Outpatient Medications (Other)  Medication Sig  . amLODipine (NORVASC) 2.5 MG tablet Take 2.5 mg by mouth daily.  Marland Kitchen atorvastatin (LIPITOR) 40 MG tablet Take 40 mg by mouth daily.  Marland Kitchen glimepiride (AMARYL) 4 MG tablet Take 4 mg by mouth daily with breakfast.  . hydrochlorothiazide (HYDRODIURIL) 25 MG tablet Take 25 mg by mouth daily.  . insulin glargine (LANTUS) 100 UNIT/ML injection Inject 15 Units into the skin at bedtime.  . sitaGLIPtin (JANUVIA) 100 MG tablet Take 100 mg by mouth daily.   No current facility-administered medications for this visit. (Other)      REVIEW OF SYSTEMS:    ALLERGIES No Known Allergies  PAST MEDICAL HISTORY Past Medical History:  Diagnosis Date  . Diabetes mellitus, type 2 ([place])   . Hypercholesteremia   . Hypertension    Past Surgical History:  Procedure Laterality Date  . NO PAST SURGERIES      FAMILY HISTORY Family History  Problem Relation Age of Onset  . Colon cancer  Father   . Esophageal cancer Neg Hx   . Stomach cancer Neg Hx   . Rectal cancer Neg Hx     SOCIAL HISTORY Social History   Tobacco Use  . Smoking status: Never Smoker  . Smokeless tobacco: Never Used  Substance Use Topics  . Alcohol use: No  . Drug use: No         OPHTHALMIC EXAM: Base Eye Exam    Visual Acuity (ETDRS)      Right Left   Dist [place] 20/25 20/20 -1       Tonometry (Tonopen, 10:38 AM)      Right Left   Pressure 09 09       Pupils      Pupils Dark Light Shape React APD   Right PERRL 3 2 Round Brisk None   Left PERRL 3 2 Round Brisk None       Visual Fields (Counting fingers)      Left Right    Full Full       Extraocular Movement      Right Left    Full Full       Neuro/Psych    Oriented x3: Yes   Mood/Affect: Normal       Dilation    Both eyes: 1.0% Mydriacyl, 2.5% Phenylephrine @  10:41 AM          IMAGING AND PROCEDURES  Imaging and Procedures for 08/26/20  Color Fundus Photography Optos - OU - Both Eyes       Right Eye Progression has been stable. Disc findings include normal observations. Macula : exudates, microaneurysms, edema.   Left Eye Progression has been stable. Disc findings include normal observations. Macula : microaneurysms, edema.   Notes OD with CSME, superior to the fovea, quiescent PDR  OS with extensive retinal nonperfusion peripherally particularly nasal and posteriorly.  Small region of CSME temporal to the fovea which will need focal laser treatment in the near future       Fluorescein Angiography Optos (Transit OD)       Injection:  500 mg Fluorescein Sodium 10 % injection   NDC: [phone]   Route: IntravenousRight Eye   Progression has worsened. Early phase findings include leakage, staining, microaneurysm. Mid/Late phase findings include leakage, window defect, microaneurysm. Choroidal neovascularization is not present.   Left Eye Mid/Late phase findings include leakage. Choroidal  neovascularization is not present.   Notes OD with CSME  superiorly and absent PRP  OS with extensive retinal capillary nonperfusion nasally, CSME temporally, will need peripheral PRP and like focal treatment in the future       Focal Laser - OD - Right Eye       Time Out Confirmed correct patient, procedure, site, and patient consented.   Anesthesia Topical anesthesia was used. Anesthetic medications included Proparacaine 0.5%.   Laser Information The type of laser was diode. Color was yellow. The duration in seconds was 0.1. The spot size was 130 microns. Laser power was 80. Total spots was 34.   Post-op The patient tolerated the procedure well. There were no complications. The patient received written and verbal post procedure care education.   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 Controlled type 2 diabetes mellitus with stable proliferative retinopathy of right eye, unspecified whether long term insulin use (HCC)  S56.8127 Color Fundus Photography Optos - OU - Both Eyes    Fluorescein Angiography Optos (Transit OD)    Fluorescein Sodium 10 % injection 500 mg     Focal Laser - OD - Right Eye    CANCELED: OCT, Retina - OU - Both Eyes  2. Controlled type 2 diabetes mellitus with left eye affected by severe nonproliferative retinopathy and macular edema, unspecified whether long term insulin use (HCC)  N17.0017 Color Fundus Photography Optos - OU - Both Eyes    Fluorescein Angiography Optos (Transit OD)    Fluorescein Sodium 10 % injection 500 mg    CANCELED: OCT, Retina - OU - Both Eyes  3. Diabetic macular edema of right  eye with proliferative retinopathy associated with type 2 diabetes mellitus ([place])  E11.3511     1.  Focal laser treatment delivered OD today, guided by OCT and FFA.  2.  Next visit, dilate OS only, PRP nasal.  Capillary nonperfusion, will continue to monitor temporal portion of the macula which has fluorescein angiographic leakage yet it is not clinically significant as there is no retinal thickening on OCT or clinical examination  3.  Ophthalmic Meds Ordered this visit:  Meds ordered this encounter  Medications  . Fluorescein Sodium 10 % injection 500 mg       Return in about 8 weeks (around 10/21/2020) for dilate, OS, PRP,, nasal left eye anterior.  There are no Patient Instructions on file for this visit.   Explained the diagnoses, plan, and follow up with the patient and they expressed understanding.  Patient expressed understanding of the importance of proper follow up care.   Clent Demark Wendy Hoback M.D. Diseases & Surgery of the Retina and Vitreous Retina & Diabetic [facility] 08/26/20     Abbreviations: M myopia (nearsighted); A astigmatism; H hyperopia (farsighted); P presbyopia; Mrx spectacle prescription;  CTL contact lenses; OD right eye; OS left eye; OU both eyes  XT exotropia; ET esotropia; PEK punctate epithelial keratitis; PEE punctate epithelial erosions; DES dry eye syndrome; MGD meibomian gland dysfunction; ATs artificial tears; PFAT's preservative free artificial tears; [place] nuclear sclerotic cataract; PSC  posterior subcapsular cataract; ERM epi-retinal membrane; PVD posterior vitreous detachment; RD retinal detachment; DM diabetes mellitus; DR diabetic retinopathy; NPDR non-proliferative diabetic retinopathy; PDR proliferative diabetic retinopathy; CSME clinically significant macular edema; DME diabetic macular edema; dbh dot blot hemorrhages; CWS cotton wool spot; POAG primary open angle glaucoma; C/D cup-to-disc ratio; HVF humphrey visual field; GVF goldmann visual field; OCT optical coherence tomography; IOP intraocular pressure; BRVO Branch retinal vein occlusion; CRVO central retinal vein occlusion; CRAO central retinal artery occlusion; BRAO branch retinal artery occlusion; RT retinal tear; SB scleral buckle; PPV pars plana vitrectomy; VH Vitreous hemorrhage; PRP panretinal laser photocoagulation; IVK intravitreal kenalog; VMT vitreomacular traction; MH Macular hole;  NVD neovascularization of the disc; NVE neovascularization elsewhere; AREDS age related eye disease study; ARMD age related macular degeneration; POAG primary open angle glaucoma; EBMD epithelial/anterior basement membrane dystrophy; ACIOL anterior chamber intraocular lens; IOL intraocular lens; PCIOL posterior chamber intraocular lens; Phaco/IOL phacoemulsification with intraocular lens placement; [place] photorefractive keratectomy; LASIK laser assisted in situ keratomileusis; HTN hypertension; DM diabetes mellitus; COPD chronic obstructive pulmonary disease

## 2020-08-26 NOTE — Assessment & Plan Note (Signed)
In the region of previous grid laser photocoagulation.Individual microaneurysms are noted to be leaking superotemporal and temporal to the fovea the right eye  Focal treated to these areas nicely today with documented color change to each of the individual microaneurysms i

## 2020-08-26 NOTE — Assessment & Plan Note (Signed)
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

## 2020-09-09 DIAGNOSIS — Z23 Encounter for immunization: Secondary | ICD-10-CM | POA: Diagnosis not present

## 2020-09-09 DIAGNOSIS — R946 Abnormal results of thyroid function studies: Secondary | ICD-10-CM | POA: Diagnosis not present

## 2020-09-09 DIAGNOSIS — I129 Hypertensive chronic kidney disease with stage 1 through stage 4 chronic kidney disease, or unspecified chronic kidney disease: Secondary | ICD-10-CM | POA: Diagnosis not present

## 2020-09-09 DIAGNOSIS — Z794 Long term (current) use of insulin: Secondary | ICD-10-CM | POA: Diagnosis not present

## 2020-09-09 DIAGNOSIS — E11319 Type 2 diabetes mellitus with unspecified diabetic retinopathy without macular edema: Secondary | ICD-10-CM | POA: Diagnosis not present

## 2020-09-09 DIAGNOSIS — R809 Proteinuria, unspecified: Secondary | ICD-10-CM | POA: Diagnosis not present

## 2020-09-09 DIAGNOSIS — E1129 Type 2 diabetes mellitus with other diabetic kidney complication: Secondary | ICD-10-CM | POA: Diagnosis not present

## 2020-09-09 DIAGNOSIS — N184 Chronic kidney disease, stage 4 (severe): Secondary | ICD-10-CM | POA: Diagnosis not present

## 2020-09-09 DIAGNOSIS — E663 Overweight: Secondary | ICD-10-CM | POA: Diagnosis not present

## 2020-09-24 DIAGNOSIS — E11319 Type 2 diabetes mellitus with unspecified diabetic retinopathy without macular edema: Secondary | ICD-10-CM | POA: Diagnosis not present

## 2020-10-21 ENCOUNTER — Encounter (INDEPENDENT_AMBULATORY_CARE_PROVIDER_SITE_OTHER): Payer: Medicare PPO | Admitting: Ophthalmology

## 2020-10-24 DIAGNOSIS — R21 Rash and other nonspecific skin eruption: Secondary | ICD-10-CM | POA: Diagnosis not present

## 2020-10-24 DIAGNOSIS — L299 Pruritus, unspecified: Secondary | ICD-10-CM | POA: Diagnosis not present

## 2020-10-27 ENCOUNTER — Encounter (INDEPENDENT_AMBULATORY_CARE_PROVIDER_SITE_OTHER): Payer: Medicare PPO | Admitting: Ophthalmology

## 2020-11-03 ENCOUNTER — Other Ambulatory Visit: Payer: Self-pay

## 2020-11-03 ENCOUNTER — Encounter (INDEPENDENT_AMBULATORY_CARE_PROVIDER_SITE_OTHER): Payer: Medicare PPO | Admitting: Ophthalmology

## 2020-11-03 ENCOUNTER — Encounter (INDEPENDENT_AMBULATORY_CARE_PROVIDER_SITE_OTHER): Payer: Self-pay | Admitting: Ophthalmology

## 2020-11-03 ENCOUNTER — Ambulatory Visit (INDEPENDENT_AMBULATORY_CARE_PROVIDER_SITE_OTHER): Payer: Medicare PPO | Admitting: Ophthalmology

## 2020-11-03 DIAGNOSIS — E113511 Type 2 diabetes mellitus with proliferative diabetic retinopathy with macular edema, right eye: Secondary | ICD-10-CM

## 2020-11-03 DIAGNOSIS — E113412 Type 2 diabetes mellitus with severe nonproliferative diabetic retinopathy with macular edema, left eye: Secondary | ICD-10-CM

## 2020-11-03 NOTE — Progress Notes (Signed)
11/03/2020     CHIEF COMPLAINT Patient presents for Retina Follow Up (9 Week PRP OS//Pt denies noticeable changes to [place] OU since last visit. Pt denies ocular pain, flashes of light, or floaters OU. //LBS: 142 last night)   HISTORY OF PRESENT ILLNESS: Linda Booker is a 75 y.o. female who presents to the clinic today for:   HPI    Retina Follow Up    Patient presents with  Diabetic Retinopathy.  In left eye.  This started 9 weeks ago.  Severity is mild.  Duration of 9 weeks.  Since onset it is stable. Additional comments: 9 Week PRP OS  Pt denies noticeable changes to [place] OU since last visit. Pt denies ocular pain, flashes of light, or floaters OU.   LBS: 142 last night       Last edited by Rockie Neighbours, [place] on 11/03/2020  9:21 AM. (History)      Referring physician: Shon Baton, MD [place],  [place] [address]  HISTORICAL INFORMATION:   Selected notes from the [facility]: No current outpatient medications on file. (Ophthalmic Drugs)   No current facility-administered medications for this visit. (Ophthalmic Drugs)   Current Outpatient Medications (Other)  Medication Sig  . amLODipine (NORVASC) 2.5 MG tablet Take 2.5 mg by mouth daily.  Marland Kitchen atorvastatin (LIPITOR) 40 MG tablet Take 40 mg by mouth daily.  Marland Kitchen glimepiride (AMARYL) 4 MG tablet Take 4 mg by mouth daily with breakfast.  . hydrochlorothiazide (HYDRODIURIL) 25 MG tablet Take 25 mg by mouth daily.  . insulin glargine (LANTUS) 100 UNIT/ML injection Inject 15 Units into the skin at bedtime.  . sitaGLIPtin (JANUVIA) 100 MG tablet Take 100 mg by mouth daily.   No current facility-administered medications for this visit. (Other)      REVIEW OF SYSTEMS:    ALLERGIES No Known Allergies  PAST MEDICAL HISTORY Past Medical History:  Diagnosis Date  . Diabetes mellitus, type 2 ([place])   . Hypercholesteremia   . Hypertension    Past Surgical History:   Procedure Laterality Date  . NO PAST SURGERIES      FAMILY HISTORY Family History  Problem Relation Age of Onset  . Colon cancer Father   . Esophageal cancer Neg Hx   . Stomach cancer Neg Hx   . Rectal cancer Neg Hx     SOCIAL HISTORY Social History   Tobacco Use  . Smoking status: Never Smoker  . Smokeless tobacco: Never Used  Substance Use Topics  . Alcohol use: No  . Drug use: No         OPHTHALMIC EXAM: Base Eye Exam    Visual Acuity (ETDRS)      Right Left   Dist [place] 20/30 +2 20/20 -1   Dist ph [place] 20/20        Tonometry (Tonopen, 9:22 AM)      Right Left   Pressure 15 18       Pupils      Pupils Dark Light Shape React APD   Right PERRL 3 2 Round Brisk None   Left PERRL 3 2 Round Brisk None       Visual Fields (Counting fingers)      Left Right    Full Full       Extraocular Movement      Right Left    Full Full       Neuro/Psych  Oriented x3: Yes       Dilation    Left eye: 1.0% Mydriacyl, 2.5% Phenylephrine @ 9:24 AM          IMAGING AND PROCEDURES  Imaging and Procedures for 11/03/20  Panretinal Photocoagulation - OS - Left Eye       Anesthesia Topical anesthesia was used. Anesthetic medications included Proparacaine 0.5%.   Laser Information The type of laser was diode. Color was yellow. The duration in seconds was 0.03. The spot size was 390 microns. Laser power was 260. Total spots was 560.   Post-op The patient tolerated the procedure well. There were no complications. The patient received written and verbal post procedure care education.   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 PRP planned nasal retina OS today to prevent progression of severe NPDR to PDR given the status of the fellow eye disease.   2. Dilate OU next monitor resolution of CSME status post focal some 2 months previous, OD  3.  Ophthalmic Meds Ordered this visit:  No orders of the defined types were placed in this encounter.      Return in about 3 months (around 01/31/2021) for DILATE OU, OCT, COLOR FP.  There are no Patient Instructions on file for this visit.   Explained the diagnoses, plan, and follow up with the patient and they expressed understanding.  Patient expressed understanding of the importance of proper follow up care.   Clent Demark Breasia Karges M.D. Diseases & Surgery of the Retina and Vitreous Retina & Diabetic [place] 11/03/20     Abbreviations: M myopia (nearsighted); A astigmatism; H hyperopia (farsighted); P presbyopia; Mrx spectacle prescription;  CTL contact lenses; OD right eye; OS left eye; OU both eyes  XT exotropia; ET esotropia; PEK punctate epithelial keratitis; PEE punctate epithelial erosions; DES dry eye syndrome; MGD meibomian gland dysfunction; ATs artificial tears; PFAT's preservative free artificial tears; [place] nuclear sclerotic cataract; PSC posterior subcapsular cataract; ERM epi-retinal membrane; PVD posterior vitreous detachment; RD retinal detachment; DM diabetes mellitus; DR diabetic retinopathy; NPDR non-proliferative diabetic  retinopathy; PDR proliferative diabetic retinopathy; CSME clinically significant macular edema; DME diabetic macular edema; dbh dot blot hemorrhages; CWS cotton wool spot; POAG primary open angle glaucoma; C/D cup-to-disc ratio; HVF humphrey visual field; GVF goldmann visual field; OCT optical coherence tomography; IOP intraocular pressure; BRVO Branch retinal vein occlusion; CRVO central retinal vein occlusion; CRAO central retinal artery occlusion; BRAO branch retinal artery occlusion; RT retinal tear; SB scleral buckle; PPV pars plana vitrectomy; VH Vitreous hemorrhage; PRP panretinal laser photocoagulation; IVK intravitreal kenalog; VMT vitreomacular traction; MH Macular hole;  NVD neovascularization of the disc;  NVE neovascularization elsewhere; AREDS age related eye disease study; ARMD age related macular degeneration; POAG primary open angle glaucoma; EBMD epithelial/anterior basement membrane dystrophy; ACIOL anterior chamber intraocular lens; IOL intraocular lens; PCIOL posterior chamber intraocular lens; Phaco/IOL phacoemulsification with intraocular lens placement; [place] photorefractive keratectomy; LASIK laser assisted in situ keratomileusis; HTN hypertension; DM diabetes mellitus; COPD chronic obstructive pulmonary disease

## 2020-11-03 NOTE — Assessment & Plan Note (Signed)
Areas of capillary nonperfusion and progressive large dot blot hemorrhages nasally OS, will deliver PRP to completely quiescent stage of peripheral retinopathy to prevent progression to PDR S

## 2020-11-03 NOTE — Assessment & Plan Note (Signed)
Improving post focal

## 2020-11-20 DIAGNOSIS — L249 Irritant contact dermatitis, unspecified cause: Secondary | ICD-10-CM | POA: Diagnosis not present

## 2020-11-20 DIAGNOSIS — L299 Pruritus, unspecified: Secondary | ICD-10-CM | POA: Diagnosis not present

## 2021-02-02 ENCOUNTER — Encounter (INDEPENDENT_AMBULATORY_CARE_PROVIDER_SITE_OTHER): Payer: Medicare PPO | Admitting: Ophthalmology

## 2021-02-12 DIAGNOSIS — M109 Gout, unspecified: Secondary | ICD-10-CM | POA: Diagnosis not present

## 2021-02-12 DIAGNOSIS — E785 Hyperlipidemia, unspecified: Secondary | ICD-10-CM | POA: Diagnosis not present

## 2021-02-12 DIAGNOSIS — E11319 Type 2 diabetes mellitus with unspecified diabetic retinopathy without macular edema: Secondary | ICD-10-CM | POA: Diagnosis not present

## 2021-02-23 DIAGNOSIS — M8589 Other specified disorders of bone density and structure, multiple sites: Secondary | ICD-10-CM | POA: Diagnosis not present

## 2021-02-23 DIAGNOSIS — I129 Hypertensive chronic kidney disease with stage 1 through stage 4 chronic kidney disease, or unspecified chronic kidney disease: Secondary | ICD-10-CM | POA: Diagnosis not present

## 2021-02-23 DIAGNOSIS — E11319 Type 2 diabetes mellitus with unspecified diabetic retinopathy without macular edema: Secondary | ICD-10-CM | POA: Diagnosis not present

## 2021-02-23 DIAGNOSIS — Z0000 Encounter for general adult medical examination without abnormal findings: Secondary | ICD-10-CM | POA: Diagnosis not present

## 2021-02-23 DIAGNOSIS — E785 Hyperlipidemia, unspecified: Secondary | ICD-10-CM | POA: Diagnosis not present

## 2021-02-23 DIAGNOSIS — E1129 Type 2 diabetes mellitus with other diabetic kidney complication: Secondary | ICD-10-CM | POA: Diagnosis not present

## 2021-02-23 DIAGNOSIS — Z794 Long term (current) use of insulin: Secondary | ICD-10-CM | POA: Diagnosis not present

## 2021-02-23 DIAGNOSIS — R82998 Other abnormal findings in urine: Secondary | ICD-10-CM | POA: Diagnosis not present

## 2021-02-23 DIAGNOSIS — E663 Overweight: Secondary | ICD-10-CM | POA: Diagnosis not present

## 2021-02-23 DIAGNOSIS — N184 Chronic kidney disease, stage 4 (severe): Secondary | ICD-10-CM | POA: Diagnosis not present

## 2021-03-03 ENCOUNTER — Ambulatory Visit (INDEPENDENT_AMBULATORY_CARE_PROVIDER_SITE_OTHER): Payer: Medicare PPO | Admitting: Ophthalmology

## 2021-03-03 ENCOUNTER — Encounter (INDEPENDENT_AMBULATORY_CARE_PROVIDER_SITE_OTHER): Payer: Self-pay | Admitting: Ophthalmology

## 2021-03-03 ENCOUNTER — Other Ambulatory Visit: Payer: Self-pay

## 2021-03-03 DIAGNOSIS — E113511 Type 2 diabetes mellitus with proliferative diabetic retinopathy with macular edema, right eye: Secondary | ICD-10-CM | POA: Diagnosis not present

## 2021-03-03 DIAGNOSIS — E113412 Type 2 diabetes mellitus with severe nonproliferative diabetic retinopathy with macular edema, left eye: Secondary | ICD-10-CM

## 2021-03-03 DIAGNOSIS — H4311 Vitreous hemorrhage, right eye: Secondary | ICD-10-CM

## 2021-03-03 DIAGNOSIS — D3131 Benign neoplasm of right choroid: Secondary | ICD-10-CM

## 2021-03-03 NOTE — Assessment & Plan Note (Signed)
OD, less exudate and less thickening temporal to fovea 6 months post focal laser treatment, quiescent PDR

## 2021-03-03 NOTE — Progress Notes (Signed)
03/03/2021     CHIEF COMPLAINT Patient presents for Retina Follow Up (4 Mo F/U OU//Pt denies noticeable changes to [place] OU since last visit. Pt denies ocular pain, flashes of light, or floaters OU. /A1c: 7.0, 06/22/LBS: 125 last night)   HISTORY OF PRESENT ILLNESS: Linda Booker is a 75 y.o. female who presents to the clinic today for:   HPI     Retina Follow Up           Diagnosis: Diabetic Retinopathy   Laterality: both eyes   Onset: 4 months ago   Severity: mild   Duration: 4 months   Course: stable   Comments: 4 Mo F/U OU  Pt denies noticeable changes to [place] OU since last visit. Pt denies ocular pain, flashes of light, or floaters OU.  A1c: 7.0, 06/22 LBS: 125 last night       Last edited by Milly Jakob, [place] on 03/03/2021  9:23 AM.      Referring physician: Shon Baton, MD [place],  [place] [address]  HISTORICAL INFORMATION:   Selected notes from the [facility]: No current outpatient medications on file. (Ophthalmic Drugs)   No current facility-administered medications for this visit. (Ophthalmic Drugs)   Current Outpatient Medications (Other)  Medication Sig   amLODipine (NORVASC) 2.5 MG tablet Take 2.5 mg by mouth daily.   atorvastatin (LIPITOR) 40 MG tablet Take 40 mg by mouth daily.   glimepiride (AMARYL) 4 MG tablet Take 4 mg by mouth daily with breakfast.   hydrochlorothiazide (HYDRODIURIL) 25 MG tablet Take 25 mg by mouth daily.   insulin glargine (LANTUS) 100 UNIT/ML injection Inject 15 Units into the skin at bedtime.   sitaGLIPtin (JANUVIA) 100 MG tablet Take 100 mg by mouth daily.   No current facility-administered medications for this visit. (Other)      REVIEW OF SYSTEMS:    ALLERGIES No Known Allergies  PAST MEDICAL HISTORY Past Medical History:  Diagnosis Date   Diabetes mellitus, type 2 ([place])    Hypercholesteremia    Hypertension    Past Surgical History:  Procedure  Laterality Date   NO PAST SURGERIES      FAMILY HISTORY Family History  Problem Relation Age of Onset   Colon cancer Father    Esophageal cancer Neg Hx    Stomach cancer Neg Hx    Rectal cancer Neg Hx     SOCIAL HISTORY Social History   Tobacco Use   Smoking status: Never   Smokeless tobacco: Never  Substance Use Topics   Alcohol use: No   Drug use: No         OPHTHALMIC EXAM:  Base Eye Exam     Visual Acuity (ETDRS)       Right Left   Dist [place] 20/20 -2 20/20         Tonometry (Tonopen, 9:26 AM)       Right Left   Pressure 15 17         Pupils       Pupils Dark Light Shape React APD   Right PERRL 3 2 Round Brisk None   Left PERRL 3 2 Round Brisk None         Visual Fields (Counting fingers)       Left Right    Full Full         Extraocular Movement       Right Left  Full Full         Neuro/Psych     Oriented x3: Yes   Mood/Affect: Normal         Dilation     Both eyes: 1.0% Mydriacyl, 2.5% Phenylephrine @ 9:26 AM           Slit Lamp and Fundus Exam     External Exam       Right Left   External Normal Normal         Slit Lamp Exam       Right Left   Lids/Lashes Normal Normal   Conjunctiva/Sclera White and quiet White and quiet   Cornea Clear Clear   Anterior Chamber Deep and quiet Deep and quiet   Iris Round and reactive Round and reactive   Lens Centered posterior chamber intraocular lens Centered posterior chamber intraocular lens   Anterior Vitreous Normal Normal         Fundus Exam       Right Left   Posterior Vitreous Normal Normal   Disc Normal Normal   C/D Ratio 0.25 0.25   Macula Microaneurysms, fewer exudates and less macular thickening Microaneurysms, no clinically significant macular edema   Vessels PDR-quiet NPDR-Severe,    Periphery Choroidal nevus small superior to the optic nerve, 1.0 disc diameter in size, flat, regular pigment, no atrophy, and no lipofuscin and no red, good PRP  360 Good PRP 360 anterior retina            IMAGING AND PROCEDURES  Imaging and Procedures for 03/03/21  OCT, Retina - OU - Both Eyes       Right Eye Quality was good. Scan locations included subfoveal. Central Foveal Thickness: 290. Progression has worsened. Findings include abnormal foveal contour.   Left Eye Quality was good. Scan locations included subfoveal. Central Foveal Thickness: 284. Progression has been stable. Findings include normal observations.   Notes  CSME OU improved no treatment required     Color Fundus Photography Optos - OU - Both Eyes       Right Eye Progression has been stable. Disc findings include normal observations. Macula : exudates, microaneurysms, edema.   Left Eye Progression has been stable. Disc findings include normal observations. Macula : microaneurysms, edema.   Notes OD with CSME, superior to the fovea, quiescent PDR, and less exudates and less thickening temporally  OS with ex tensive retinal nonperfusion peripherally particularly nasal and posteriorly.  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 Vitreous hemorrhage of right eye (HCC)  H43.11       1.  Quiescent PDR OD with improving CSME status post recent focal  2.  OS with very severe NPDR and now treated with PRP To prevent progression of retinal nonperfusion to PDR very clear media OS no active disease  3.  Dilate OU next  Ophthalmic Meds Ordered this visit:  No orders of the defined types were placed in this encounter.      Return in about 4 months (around 07/03/2021) for OCT, DILATE OU.  There are no Patient Instructions on file for this visit.   Explained the diagnoses, plan, and follow up with the patient and they expressed understanding.  Patient expressed understanding of the importance of proper follow up care.   Clent Demark Dhanush Jokerst M.D. Diseases & Surgery of the Retina and Vitreous Retina & Diabetic [place] 03/03/21     Abbreviations: M myopia (nearsighted); A astigmatism; H hyperopia (farsighted); P presbyopia; Mrx spectacle prescription;  CTL contact lenses; OD right eye; OS left eye; OU both eyes  XT exotropia; ET esotropia; PEK punctate epithelial keratitis; PEE punctate epithelial erosions; DES dry eye syndrome; MGD meibomian gland dysfunction; ATs artificial tears; PFAT's preservative free artificial tears; [place] nuclear sclerotic cataract; PSC posterior subcapsular cataract; ERM epi-retinal membrane; PVD posterior vitreous detachment; RD retinal detachment; DM diabetes mellitus; DR diabetic retinopathy; NPDR non-proliferative diabetic retinopathy; PDR proliferative diabetic retinopathy; CSME clinically significant macular edema; DME diabetic macular edema; dbh dot blot hemorrhages; CWS cotton wool spot; POAG  primary open angle glaucoma; C/D cup-to-disc ratio; HVF humphrey visual field; GVF goldmann visual field; OCT optical coherence tomography; IOP intraocular pressure; BRVO Branch retinal vein occlusion; CRVO central retinal vein occlusion; CRAO central retinal artery occlusion; BRAO branch retinal artery occlusion; RT retinal tear; SB scleral buckle; PPV pars plana vitrectomy; VH Vitreous hemorrhage; PRP panretinal laser photocoagulation; IVK intravitreal kenalog; VMT vitreomacular traction; MH Macular hole;  NVD neovascularization of the disc; NVE neovascularization elsewhere; AREDS age related eye disease study; ARMD age related macular degeneration; POAG primary open angle glaucoma; EBMD epithelial/anterior basement membrane dystrophy; ACIOL anterior chamber intraocular lens; IOL intraocular lens; PCIOL posterior chamber intraocular lens; Phaco/IOL phacoemulsification with intraocular lens placement; [place] photorefractive keratectomy; LASIK laser assisted in situ keratomileusis; HTN hypertension; DM diabetes mellitus; COPD chronic obstructive pulmonary disease

## 2021-03-03 NOTE — Assessment & Plan Note (Signed)
No change in size or appearance of small nevus superonasal to the nerve OD

## 2021-03-03 NOTE — Assessment & Plan Note (Signed)
Resolved OD

## 2021-03-03 NOTE — Assessment & Plan Note (Signed)
Good peripheral PRP anterior retina to prevent progression of PRP stable

## 2021-03-27 DIAGNOSIS — M8589 Other specified disorders of bone density and structure, multiple sites: Secondary | ICD-10-CM | POA: Diagnosis not present

## 2021-05-16 ENCOUNTER — Encounter (HOSPITAL_COMMUNITY): Payer: Self-pay

## 2021-05-16 ENCOUNTER — Other Ambulatory Visit: Payer: Self-pay

## 2021-05-16 ENCOUNTER — Inpatient Hospital Stay (HOSPITAL_COMMUNITY): Payer: Medicare PPO

## 2021-05-16 ENCOUNTER — Observation Stay (HOSPITAL_COMMUNITY)
Admission: EM | Admit: 2021-05-16 | Discharge: 2021-05-17 | Disposition: A | Discharge status: Home / Self Care | Payer: Medicare PPO | Attending: Family Medicine | Admitting: Family Medicine

## 2021-05-16 ENCOUNTER — Encounter: Payer: Self-pay | Admitting: Family Medicine

## 2021-05-16 DIAGNOSIS — Z7984 Long term (current) use of oral hypoglycemic drugs: Secondary | ICD-10-CM | POA: Diagnosis not present

## 2021-05-16 DIAGNOSIS — R112 Nausea with vomiting, unspecified: Secondary | ICD-10-CM | POA: Diagnosis not present

## 2021-05-16 DIAGNOSIS — U071 COVID-19: Principal | ICD-10-CM | POA: Insufficient documentation

## 2021-05-16 DIAGNOSIS — E11649 Type 2 diabetes mellitus with hypoglycemia without coma: Secondary | ICD-10-CM | POA: Diagnosis not present

## 2021-05-16 DIAGNOSIS — E113412 Type 2 diabetes mellitus with severe nonproliferative diabetic retinopathy with macular edema, left eye: Secondary | ICD-10-CM | POA: Diagnosis not present

## 2021-05-16 DIAGNOSIS — E162 Hypoglycemia, unspecified: Secondary | ICD-10-CM

## 2021-05-16 DIAGNOSIS — Z79899 Other long term (current) drug therapy: Secondary | ICD-10-CM | POA: Diagnosis not present

## 2021-05-16 DIAGNOSIS — R531 Weakness: Secondary | ICD-10-CM | POA: Diagnosis not present

## 2021-05-16 DIAGNOSIS — N179 Acute kidney failure, unspecified: Secondary | ICD-10-CM | POA: Insufficient documentation

## 2021-05-16 DIAGNOSIS — I10 Essential (primary) hypertension: Secondary | ICD-10-CM | POA: Insufficient documentation

## 2021-05-16 DIAGNOSIS — Z794 Long term (current) use of insulin: Secondary | ICD-10-CM | POA: Diagnosis not present

## 2021-05-16 DIAGNOSIS — R1111 Vomiting without nausea: Secondary | ICD-10-CM | POA: Diagnosis not present

## 2021-05-16 DIAGNOSIS — R5381 Other malaise: Secondary | ICD-10-CM | POA: Diagnosis present

## 2021-05-16 DIAGNOSIS — M8580 Other specified disorders of bone density and structure, unspecified site: Secondary | ICD-10-CM | POA: Diagnosis present

## 2021-05-16 DIAGNOSIS — R110 Nausea: Secondary | ICD-10-CM | POA: Diagnosis not present

## 2021-05-16 DIAGNOSIS — E7800 Pure hypercholesterolemia, unspecified: Secondary | ICD-10-CM | POA: Insufficient documentation

## 2021-05-16 LAB — URINALYSIS, ROUTINE W REFLEX MICROSCOPIC
Bilirubin Urine: NEGATIVE
Glucose, UA: 50 mg/dL — AB
Ketones, ur: NEGATIVE mg/dL
Leukocytes,Ua: NEGATIVE
Nitrite: NEGATIVE
Protein, ur: 100 mg/dL — AB
Specific Gravity, Urine: 1.006 (ref 1.005–1.030)
pH: 5 (ref 5.0–8.0)

## 2021-05-16 LAB — RESP PANEL BY RT-PCR (FLU A&B, COVID) ARPGX2
Influenza A by PCR: NEGATIVE
Influenza B by PCR: NEGATIVE
SARS Coronavirus 2 by RT PCR: POSITIVE — AB

## 2021-05-16 LAB — CBC WITH DIFFERENTIAL/PLATELET
Abs Immature Granulocytes: 0.01 10*3/uL (ref 0.00–0.07)
Basophils Absolute: 0 10*3/uL (ref 0.0–0.1)
Basophils Relative: 0 %
Eosinophils Absolute: 0 10*3/uL (ref 0.0–0.5)
Eosinophils Relative: 0 %
HCT: 34.5 % — ABNORMAL LOW (ref 36.0–46.0)
Hemoglobin: 11.7 g/dL — ABNORMAL LOW (ref 12.0–15.0)
Immature Granulocytes: 0 %
Lymphocytes Relative: 14 %
Lymphs Abs: 0.9 10*3/uL (ref 0.7–4.0)
MCH: 30.2 pg (ref 26.0–34.0)
MCHC: 33.9 g/dL (ref 30.0–36.0)
MCV: 88.9 fL (ref 80.0–100.0)
Monocytes Absolute: 0.7 10*3/uL (ref 0.1–1.0)
Monocytes Relative: 11 %
Neutro Abs: 4.8 10*3/uL (ref 1.7–7.7)
Neutrophils Relative %: 75 %
Platelets: 221 10*3/uL (ref 150–400)
RBC: 3.88 MIL/uL (ref 3.87–5.11)
RDW: 13.2 % (ref 11.5–15.5)
WBC: 6.4 10*3/uL (ref 4.0–10.5)
nRBC: 0 % (ref 0.0–0.2)

## 2021-05-16 LAB — COMPREHENSIVE METABOLIC PANEL
ALT: 29 U/L (ref 0–44)
AST: 65 U/L — ABNORMAL HIGH (ref 15–41)
Albumin: 4 g/dL (ref 3.5–5.0)
Alkaline Phosphatase: 88 U/L (ref 38–126)
Anion gap: 8 (ref 5–15)
BUN: 31 mg/dL — ABNORMAL HIGH (ref 8–23)
CO2: 20 mmol/L — ABNORMAL LOW (ref 22–32)
Calcium: 9 mg/dL (ref 8.9–10.3)
Chloride: 106 mmol/L (ref 98–111)
Creatinine, Ser: 1.71 mg/dL — ABNORMAL HIGH (ref 0.44–1.00)
GFR, Estimated: 31 mL/min — ABNORMAL LOW (ref >60)
Glucose, Bld: 42 mg/dL — CL (ref 70–99)
Potassium: 4.3 mmol/L (ref 3.5–5.1)
Sodium: 134 mmol/L — ABNORMAL LOW (ref 135–145)
Total Bilirubin: 0.6 mg/dL (ref 0.3–1.2)
Total Protein: 7.9 g/dL (ref 6.5–8.1)

## 2021-05-16 LAB — GLUCOSE, CAPILLARY: Glucose-Capillary: 71 mg/dL (ref 70–99)

## 2021-05-16 LAB — CBG MONITORING, ED
Glucose-Capillary: 40 mg/dL — CL (ref 70–99)
Glucose-Capillary: 151 mg/dL — ABNORMAL HIGH (ref 70–99)
Glucose-Capillary: 53 mg/dL — ABNORMAL LOW (ref 70–99)
Glucose-Capillary: 139 mg/dL — ABNORMAL HIGH (ref 70–99)

## 2021-05-16 MED ORDER — INSULIN ASPART 100 UNIT/ML IJ SOLN: 0.0000 [IU] | Freq: Three times a day (TID) | INTRAMUSCULAR | Status: DC

## 2021-05-16 MED ORDER — LINAGLIPTIN 5 MG PO TABS
5.0000 mg | ORAL_TABLET | Freq: Every day | ORAL | Status: DC
  Administered 2021-05-17: 5 mg via ORAL
  Filled 2021-05-16: qty 1

## 2021-05-16 MED ORDER — ROSUVASTATIN CALCIUM 10 MG PO TABS
10.0000 mg | ORAL_TABLET | Freq: Every evening | ORAL | Status: DC
  Administered 2021-05-16: 10 mg via ORAL
  Filled 2021-05-16: qty 1

## 2021-05-16 MED ORDER — METOPROLOL SUCCINATE ER 25 MG PO TB24
25.0000 mg | ORAL_TABLET | Freq: Every day | ORAL | Status: DC
  Administered 2021-05-17: 25 mg via ORAL
  Filled 2021-05-16: qty 1

## 2021-05-16 MED ORDER — ONDANSETRON HCL 4 MG/2ML IJ SOLN
4.0000 mg | Freq: Once | INTRAMUSCULAR | Status: AC
  Administered 2021-05-16: 4 mg via INTRAVENOUS
  Filled 2021-05-16: qty 2

## 2021-05-16 MED ORDER — INSULIN ASPART 100 UNIT/ML IJ SOLN: 4.0000 [IU] | Freq: Three times a day (TID) | INTRAMUSCULAR | Status: DC

## 2021-05-16 MED ORDER — LACTATED RINGERS IV BOLUS
1000.0000 mL | Freq: Once | INTRAVENOUS | Status: AC
  Administered 2021-05-16: 1000 mL via INTRAVENOUS

## 2021-05-16 MED ORDER — INSULIN DETEMIR 100 UNIT/ML ~~LOC~~ SOLN
20.0000 [IU] | Freq: Two times a day (BID) | SUBCUTANEOUS | Status: DC
  Filled 2021-05-16 (×4): qty 0.2

## 2021-05-16 MED ORDER — AMLODIPINE BESYLATE 5 MG PO TABS
10.0000 mg | ORAL_TABLET | Freq: Every day | ORAL | Status: DC
  Administered 2021-05-17: 10 mg via ORAL
  Filled 2021-05-16: qty 2

## 2021-05-16 MED ORDER — IRBESARTAN 150 MG PO TABS
150.0000 mg | ORAL_TABLET | Freq: Every day | ORAL | Status: DC
  Administered 2021-05-17: 150 mg via ORAL
  Filled 2021-05-16: qty 1

## 2021-05-16 MED ORDER — DEXTROSE 50 % IV SOLN
INTRAVENOUS | Status: AC
  Administered 2021-05-16: 50 mL
  Filled 2021-05-16: qty 50

## 2021-05-16 MED ORDER — NIRMATRELVIR/RITONAVIR (PAXLOVID) TABLET (RENAL DOSING)
2.0000 | ORAL_TABLET | Freq: Two times a day (BID) | ORAL | Status: DC
  Administered 2021-05-16 – 2021-05-17 (×2): 2 via ORAL
  Filled 2021-05-16: qty 20

## 2021-05-16 MED ORDER — INSULIN ASPART 100 UNIT/ML IJ SOLN: 0.0000 [IU] | Freq: Every day | INTRAMUSCULAR | Status: DC

## 2021-05-16 MED ORDER — DEXTROSE 50 % IV SOLN
25.0000 g | Freq: Once | INTRAVENOUS | Status: AC
  Administered 2021-05-16: 25 g via INTRAVENOUS

## 2021-05-16 MED ORDER — LACTATED RINGERS IV SOLN: INTRAVENOUS | Status: DC

## 2021-05-16 MED ORDER — ENOXAPARIN SODIUM 30 MG/0.3ML IJ SOSY
30.0000 mg | PREFILLED_SYRINGE | INTRAMUSCULAR | Status: DC
  Administered 2021-05-16: 30 mg via SUBCUTANEOUS
  Filled 2021-05-16: qty 0.3

## 2021-05-16 MED ORDER — DEXTROSE 50 % IV SOLN
INTRAVENOUS | Status: AC
  Filled 2021-05-16: qty 50

## 2021-05-16 NOTE — H&P (Signed)
History and Physical  Linda Booker [id number] DOB: June 15, 1946 DOA: 05/16/2021  Referring physician: Dr Karle Starch, ED physician PCP: Shon Baton, MD  Outpatient Specialists:   Patient Coming From: home  Chief Complaint: COVID, weak  HPI: Linda Booker is a 75 y.o. female with a history of diabetes, hypercholesterolemia, hypertension. She tested positive for COVID yesterday after having chills, malaise, diarrhea and poor appetite.  Her PCP prescribed her an oral medication, but she continued to feel poorly.  She denies cough, shortness of breath, chest pain, body aches.  She has had the COVID-vaccine with booster.  No palliating or provoking factors.  Emergency Department Course: COVID-positive.  Glucose 40.  Creatinine 1.71 (baseline around 1.28).  Review of Systems:   Pt denies any constipation, abdominal pain, shortness of breath, dyspnea on exertion, orthopnea, cough, wheezing, palpitations, headache, vision changes, lightheadedness, dizziness, melena, rectal bleeding.  Review of systems are otherwise negative  Past Medical History:  Diagnosis Date   Diabetes mellitus, type 2 ([place])    Hypercholesteremia    Hypertension    Past Surgical History:  Procedure Laterality Date   ABDOMINAL HYSTERECTOMY     NO PAST SURGERIES     Social History:  reports that she has never smoked. She has never used smokeless tobacco. She reports that she does not drink alcohol and does not use drugs. Patient lives at home  No Known Allergies  Family History  Problem Relation Age of Onset   Colon cancer Father    Esophageal cancer Neg Hx    Stomach cancer Neg Hx    Rectal cancer Neg Hx       Prior to Admission medications   Medication Sig Start Date End Date Taking? Authorizing Provider  alendronate (FOSAMAX) 70 MG tablet Take 70 mg by mouth every Wednesday. 03/30/21  Yes [provider]  amLODipine (NORVASC) 10 MG tablet Take 10 mg by mouth daily. 03/11/21  Yes [provider]  glimepiride (AMARYL) 4 MG tablet Take 4 mg by mouth daily with breakfast.   Yes [provider]  irbesartan (AVAPRO) 150 MG tablet Take 150 mg by mouth daily. 05/12/21  Yes [provider]  LAGEVRIO 200 MG CAPS Take 4 capsules by mouth every 12 (twelve) hours. 05/15/21  Yes [provider]  metoprolol succinate (TOPROL-XL) 25 MG 24 hr tablet Take 25 mg by mouth daily. 05/12/21  Yes [provider]  NOVOLOG MIX 70/30 FLEXPEN (70-30) 100 UNIT/ML FlexPen Inject 25-26 Units into the skin 2 (two) times daily. 25 units with breakfast and 26 units with supper 05/12/21  Yes [provider]  rosuvastatin (CRESTOR) 10 MG tablet Take 10 mg by mouth every evening. 03/11/21  Yes [provider]  sitaGLIPtin (JANUVIA) 100 MG tablet Take 100 mg by mouth daily.   Yes [provider]    Physical Exam: BP (!) 155/65   Pulse 79   Temp 98.7 F (37.1 C) (Oral)   Resp 20   Ht '5\' 1"'$  (1.549 m)   Wt 67.6 kg   SpO2 94%   BMI 28.15 kg/m   General: Elderly female. Awake and alert and oriented x3. No acute cardiopulmonary distress.  HEENT: Normocephalic atraumatic.  Right and left ears normal in appearance.  Pupils equal, round, reactive to light. Extraocular muscles are intact. Sclerae anicteric and noninjected.  Moist mucosal membranes. No mucosal lesions.  Neck: Neck supple without lymphadenopathy. No carotid bruits. No masses palpated.  Cardiovascular: Regular rate with normal S1-S2 sounds. No  murmurs, rubs, gallops auscultated. No JVD.  Respiratory: Good respiratory effort with no wheezes, rales, rhonchi. Lungs clear to auscultation bilaterally.  No accessory muscle use. Abdomen: Soft, nontender, nondistended. Active bowel sounds. No masses or hepatosplenomegaly  Skin: No rashes, lesions, or ulcerations.  Dry, warm to touch. 2+ dorsalis pedis and radial pulses. Musculoskeletal: No calf or leg pain. All major joints not erythematous  nontender.  No upper or lower joint deformation.  Good ROM.  No contractures  Psychiatric: Intact judgment and insight. Pleasant and cooperative. Neurologic: No focal neurological deficits. Strength is 5/5 and symmetric in upper and lower extremities.  Cranial nerves II through XII are grossly intact.           Labs on Admission: I have personally reviewed following labs and imaging studies  CBC: Recent Labs  Lab 05/16/21 1528  WBC 6.4  NEUTROABS 4.8  HGB 11.7*  HCT 34.5*  MCV 88.9  PLT [id number]   Basic Metabolic Panel: Recent Labs  Lab 05/16/21 1528  NA 134*  K 4.3  CL 106  CO2 20*  GLUCOSE 42*  BUN 31*  CREATININE 1.71*  CALCIUM 9.0   GFR: Estimated Creatinine Clearance: 25.8 mL/min (A) (by C-G formula based on SCr of 1.71 mg/dL (H)). Liver Function Tests: Recent Labs  Lab 05/16/21 1528  AST 65*  ALT 29  ALKPHOS 88  BILITOT 0.6  PROT 7.9  ALBUMIN 4.0   No results for input(s): LIPASE, AMYLASE in the last 168 hours. No results for input(s): AMMONIA in the last 168 hours. Coagulation Profile: No results for input(s): INR, PROTIME in the last 168 hours. Cardiac Enzymes: No results for input(s): CKTOTAL, CKMB, CKMBINDEX, TROPONINI in the last 168 hours. BNP (last 3 results) No results for input(s): PROBNP in the last 8760 hours. HbA1C: No results for input(s): HGBA1C in the last 72 hours. CBG: Recent Labs  Lab 05/16/21 1628 05/16/21 1718  GLUCAP 40* 151*   Lipid Profile: No results for input(s): CHOL, HDL, LDLCALC, TRIG, CHOLHDL, LDLDIRECT in the last 72 hours. Thyroid Function Tests: No results for input(s): TSH, T4TOTAL, FREET4, T3FREE, THYROIDAB in the last 72 hours. Anemia Panel: No results for input(s): VITAMINB12, FOLATE, FERRITIN, TIBC, IRON, RETICCTPCT in the last 72 hours. Urine analysis:    Component Value Date/Time   COLORURINE YELLOW 05/16/2021 [place] 05/16/2021 1705   LABSPEC 1.006 05/16/2021 1705   PHURINE 5.0  05/16/2021 1705   GLUCOSEU 50 (A) 05/16/2021 1705   HGBUR SMALL (A) 05/16/2021 1705   BILIRUBINUR NEGATIVE 05/16/2021 1705   KETONESUR NEGATIVE 05/16/2021 1705   PROTEINUR 100 (A) 05/16/2021 1705   NITRITE NEGATIVE 05/16/2021 1705   LEUKOCYTESUR NEGATIVE 05/16/2021 1705   Sepsis Labs: '@LABRCNTIP'$ (procalcitonin:4,lacticidven:4) ) Recent Results (from the past 240 hour(s))  Resp Panel by RT-PCR (Flu A&B, Covid) Nasopharyngeal Swab     Status: Abnormal   Collection Time: 05/16/21  3:40 PM   Specimen: Nasopharyngeal Swab; Nasopharyngeal(NP) swabs in vial transport medium  Result Value Ref Range Status   SARS Coronavirus 2 by RT PCR POSITIVE (A) NEGATIVE Final    Comment: RESULT CALLED TO, READ BACK BY AND VERIFIED WITH:  MCNEAL,E @ [id number] 05/16/21 BY STEPHTR (NOTE) SARS-CoV-2 target nucleic acids are DETECTED.  The SARS-CoV-2 RNA is generally detectable in upper respiratory specimens during the acute phase of infection. Positive results are indicative of the presence of the identified virus, but do not rule out bacterial infection or co-infection with other pathogens not detected by the  test. Clinical correlation with patient history and other diagnostic information is necessary to determine patient infection status. The expected result is Negative.  Fact Sheet for Patients: EntrepreneurPulse.com.au  Fact Sheet for Healthcare Providers: IncredibleEmployment.be  This test is not yet approved or cleared by the [place] FDA and  has been authorized for detection and/or diagnosis of SARS-CoV-2 by FDA under an Emergency Use Authorization (EUA).  This EUA will remain in effect (meaning this test can  be used) for the duration of  the COVID-19 declaration under Section 564(b)(1) of the Act, 21 U.S.C. section 360bbb-3(b)(1), unless the authorization is terminated or revoked sooner.     Influenza A by PCR NEGATIVE NEGATIVE Final   Influenza B by  PCR NEGATIVE NEGATIVE Final    Comment: (NOTE) The Xpert Xpress SARS-CoV-2/FLU/RSV plus assay is intended as an aid in the diagnosis of influenza from Nasopharyngeal swab specimens and should not be used as a sole basis for treatment. Nasal washings and aspirates are unacceptable for Xpert Xpress SARS-CoV-2/FLU/RSV testing.  Fact Sheet for Patients: EntrepreneurPulse.com.au  Fact Sheet for Healthcare Providers: IncredibleEmployment.be  This test is not yet approved or cleared by the [place] FDA and has been authorized for detection and/or diagnosis of SARS-CoV-2 by FDA under an Emergency Use Authorization (EUA). This EUA will remain in effect (meaning this test can be used) for the duration of the COVID-19 declaration under Section 564(b)(1) of the Act, 21 U.S.C. section 360bbb-3(b)(1), unless the authorization is terminated or revoked.  Performed at [facility], [address] [place], [place] [address]      Radiological Exams on Admission: DG Chest [facility] 1 View  Result Date: 05/16/2021 CLINICAL DATA:  COVID-19 positive.  Weakness. EXAM: PORTABLE CHEST 1 VIEW COMPARISON:  None. FINDINGS: The heart size and mediastinal contours are within normal limits. Both lungs are clear. The visualized skeletal structures are unremarkable. IMPRESSION: No active disease. Electronically Signed   By: Dorise Bullion III M.D.   On: 05/16/2021 17:45    EKG: Independently reviewed.  Sinus rhythm with left atrial enlargement.  Nonspecific repolarization.  No acute ST changes.  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  We also discussed care given by the ED  provider.  COVID-19 infection Start paxlovid Symptomatic treatment for nausea and vomiting Acute kidney injury IVF Recheck Cr tomorrow. Hypertension Continue antihypertensives Diabetes Levemir SSI   DVT prophylaxis: lovenox Consultants: none Code Status: full code Family Communication:   Disposition Plan: Patient should be able to return home following admission   Truett Mainland, DO

## 2021-05-16 NOTE — ED Triage Notes (Signed)
Pt arrived via EMS. Covid + 08/26. Complaints of increased weakness, diarrhea, and vomiting.

## 2021-05-16 NOTE — ED Notes (Signed)
  Assessing patient and checked CBG.  CBG was 53 but patient was asymptomatic.  Spoke with Dr. Karle Starch and patient was given amp of D50.  Will reassess in 30 minutes.

## 2021-05-16 NOTE — ED Provider Notes (Signed)
[facility] EMERGENCY DEPARTMENT Provider Note  CSN: [id number] Arrival date & time: 05/16/21 1410    History Chief Complaint  Patient presents with   Weakness    Linda Booker is a 75 y.o. female with history of HTN, DM reports she began having chills, general malaise, diarrhea and poor appetite two days ago. She had a positive home Covid test yesterday, called her PCP and was prescribed a medication she thinks is Paxlovid but continues to feel poorly today. No cough or SOB. She has had vaccine and booster x 2.    Past Medical History:  Diagnosis Date   Diabetes mellitus, type 2 ([place])    Hypercholesteremia    Hypertension     Past Surgical History:  Procedure Laterality Date   ABDOMINAL HYSTERECTOMY     NO PAST SURGERIES      Family History  Problem Relation Age of Onset   Colon cancer Father    Esophageal cancer Neg Hx    Stomach cancer Neg Hx    Rectal cancer Neg Hx     Social History   Tobacco Use   Smoking status: Never   Smokeless tobacco: Never  Vaping Use   Vaping Use: Never used  Substance Use Topics   Alcohol use: No   Drug use: No     Home Medications Prior to Admission medications   Medication Sig Start Date End Date Taking? Authorizing Provider  amLODipine (NORVASC) 2.5 MG tablet Take 2.5 mg by mouth daily.    [provider]  atorvastatin (LIPITOR) 40 MG tablet Take 40 mg by mouth daily.    [provider]  glimepiride (AMARYL) 4 MG tablet Take 4 mg by mouth daily with breakfast.    [provider]  hydrochlorothiazide (HYDRODIURIL) 25 MG tablet Take 25 mg by mouth daily.    [provider]  insulin glargine (LANTUS) 100 UNIT/ML injection Inject 15 Units into the skin at bedtime.    [provider]  sitaGLIPtin (JANUVIA) 100 MG tablet Take 100 mg by mouth daily.    [provider]     Allergies    Patient has no known allergies.   Review of Systems   Review of Systems A  comprehensive review of systems was completed and negative except as noted in HPI.     Physical Exam BP (!) 190/66   Pulse 89   Temp 98.2 F (36.8 C) (Oral)   Resp (!) 21   Ht '5\' 1"'$  (1.549 m)   Wt 67.6 kg   SpO2 96%   BMI 28.15 kg/m   Physical Exam Vitals and nursing note reviewed.  Constitutional:      Appearance: Normal appearance.  HENT:     Head: Normocephalic and atraumatic.     Nose: Nose normal.     Mouth/Throat:     Mouth: Mucous membranes are dry.  Eyes:     Extraocular Movements: Extraocular movements intact.     Conjunctiva/sclera: Conjunctivae normal.  Cardiovascular:     Rate and Rhythm: Normal rate.  Pulmonary:     Effort: Pulmonary effort is normal.     Breath sounds: Normal breath sounds.  Abdominal:     General: Abdomen is flat.     Palpations: Abdomen is soft.     Tenderness: There is no abdominal tenderness.  Musculoskeletal:        General: No swelling. Normal range of motion.     Cervical back: Neck supple.  Skin:  General: Skin is warm and dry.  Neurological:     General: No focal deficit present.     Mental Status: She is alert.  Psychiatric:        Mood and Affect: Mood normal.     ED Results / Procedures / Treatments   Labs (all labs ordered are listed, but only abnormal results are displayed) Labs Reviewed  RESP PANEL BY RT-PCR (FLU A&B, COVID) ARPGX2 - Abnormal; Notable for the following components:      Result Value   SARS Coronavirus 2 by RT PCR POSITIVE (*)    All other components within normal limits  COMPREHENSIVE METABOLIC PANEL - Abnormal; Notable for the following components:   Sodium 134 (*)    CO2 20 (*)    Glucose, Bld 42 (*)    BUN 31 (*)    Creatinine, Ser 1.71 (*)    AST 65 (*)    GFR, Estimated 31 (*)    All other components within normal limits  CBC WITH DIFFERENTIAL/PLATELET - Abnormal; Notable for the following components:   Hemoglobin 11.7 (*)    HCT 34.5 (*)    All other components within normal  limits  URINALYSIS, ROUTINE W REFLEX MICROSCOPIC    EKG EKG Interpretation  Date/Time:  Saturday May 16 2021 14:32:23 EDT Ventricular Rate:  82 PR Interval:  161 QRS Duration: 101 QT Interval:  393 QTC Calculation: 459 R Axis:   10 Text Interpretation: Sinus rhythm Probable left atrial enlargement Nonspecific repol abnormality, diffuse leads Baseline wander in lead(s) I No old tracing to compare Confirmed by Calvert Cantor [phone]) on 05/16/2021 3:45:35 PM  Radiology No results found.  Procedures Procedures  Medications Ordered in the ED Medications  lactated ringers bolus 1,000 mL (1,000 mLs Intravenous New Bag/Given 05/16/21 1549)  ondansetron (ZOFRAN) injection 4 mg (4 mg Intravenous Given 05/16/21 1546)  dextrose 50 % solution 25 g (25 g Intravenous Given 05/16/21 1634)     MDM Rules/Calculators/A&P MDM Patient with positive home Covid test, here with mostly GI symptoms and weakness. Will give IVF, antiemetics and check labs.   ED Course  I have reviewed the triage vital signs and the nursing notes.  Pertinent labs & imaging results that were available during my care of the patient were reviewed by me and considered in my medical decision making (see chart for details).  Clinical Course as of 05/16/21 1706  Sat May 16, 2021  1604 CBC with mild anemia, otherwise normal.  [CS]  [id number] CMP with hypoglycemia and AKI. D50 ordered.  [CS]  [id number] Covid confirmed positive.  [CS]  50 Spoke with Dr. Nehemiah Settle, Hospitalist, who will admit for further management of AKI, hypoglycemia in setting of Covid.  [CS]    Clinical Course User Index [CS] Truddie Hidden, MD    Final Clinical Impression(s) / ED Diagnoses Final diagnoses:  COVID-19  Hypoglycemia  AKI (acute kidney injury) [facility])    Rx / [place] Orders ED Discharge Orders     None        Truddie Hidden, MD 05/16/21 [id number]

## 2021-05-17 DIAGNOSIS — U071 COVID-19: Secondary | ICD-10-CM | POA: Diagnosis not present

## 2021-05-17 LAB — COMPREHENSIVE METABOLIC PANEL
ALT: 21 U/L (ref 0–44)
AST: 43 U/L — ABNORMAL HIGH (ref 15–41)
Albumin: 3.1 g/dL — ABNORMAL LOW (ref 3.5–5.0)
Alkaline Phosphatase: 72 U/L (ref 38–126)
Anion gap: 6 (ref 5–15)
BUN: 31 mg/dL — ABNORMAL HIGH (ref 8–23)
CO2: 23 mmol/L (ref 22–32)
Calcium: 8.9 mg/dL (ref 8.9–10.3)
Chloride: 107 mmol/L (ref 98–111)
Creatinine, Ser: 1.57 mg/dL — ABNORMAL HIGH (ref 0.44–1.00)
GFR, Estimated: 35 mL/min — ABNORMAL LOW (ref >60)
Glucose, Bld: 142 mg/dL — ABNORMAL HIGH (ref 70–99)
Potassium: 4.7 mmol/L (ref 3.5–5.1)
Sodium: 136 mmol/L (ref 135–145)
Total Bilirubin: 0.5 mg/dL (ref 0.3–1.2)
Total Protein: 6.1 g/dL — ABNORMAL LOW (ref 6.5–8.1)

## 2021-05-17 LAB — CBC WITH DIFFERENTIAL/PLATELET
Abs Immature Granulocytes: 0.02 10*3/uL (ref 0.00–0.07)
Basophils Absolute: 0 10*3/uL (ref 0.0–0.1)
Basophils Relative: 1 %
Eosinophils Absolute: 0 10*3/uL (ref 0.0–0.5)
Eosinophils Relative: 0 %
HCT: 32 % — ABNORMAL LOW (ref 36.0–46.0)
Hemoglobin: 10.7 g/dL — ABNORMAL LOW (ref 12.0–15.0)
Immature Granulocytes: 0 %
Lymphocytes Relative: 30 %
Lymphs Abs: 1.5 10*3/uL (ref 0.7–4.0)
MCH: 30.1 pg (ref 26.0–34.0)
MCHC: 33.4 g/dL (ref 30.0–36.0)
MCV: 89.9 fL (ref 80.0–100.0)
Monocytes Absolute: 0.4 10*3/uL (ref 0.1–1.0)
Monocytes Relative: 9 %
Neutro Abs: 3 10*3/uL (ref 1.7–7.7)
Neutrophils Relative %: 60 %
Platelets: 199 10*3/uL (ref 150–400)
RBC: 3.56 MIL/uL — ABNORMAL LOW (ref 3.87–5.11)
RDW: 12.9 % (ref 11.5–15.5)
WBC: 5 10*3/uL (ref 4.0–10.5)
nRBC: 0 % (ref 0.0–0.2)

## 2021-05-17 LAB — C-REACTIVE PROTEIN: CRP: 2.3 mg/dL — ABNORMAL HIGH (ref <1.0)

## 2021-05-17 LAB — GLUCOSE, CAPILLARY
Glucose-Capillary: 146 mg/dL — ABNORMAL HIGH (ref 70–99)
Glucose-Capillary: 155 mg/dL — ABNORMAL HIGH (ref 70–99)
Glucose-Capillary: 122 mg/dL — ABNORMAL HIGH (ref 70–99)

## 2021-05-17 MED ORDER — PAXLOVID (300/100) 20 X 150 MG & 10 X 100MG PO TBPK: ORAL_TABLET | ORAL | 0 refills | Status: AC

## 2021-05-17 NOTE — Care Management Obs Status (Signed)
[place] NOTIFICATION   Patient Details  Name: CHARNAE MUI MRN: [id number] Date of Birth: 02-Feb-1946   Medicare Observation Status Notification Given:  Yes    Natasha Bence, LCSW 05/17/2021, 11:37 AM

## 2021-05-17 NOTE — Plan of Care (Signed)
  Problem: Education: Goal: Knowledge of General Education information will improve Description Including pain rating scale, medication(s)/side effects and non-pharmacologic comfort measures Outcome: Progressing   Problem: Health Behavior/Discharge Planning: Goal: Ability to manage health-related needs will improve Outcome: Progressing   

## 2021-05-17 NOTE — Discharge Summary (Signed)
Physician Discharge Summary  Linda Booker [id number] DOB: 1945/10/05 DOA: 05/16/2021  PCP: Linda Baton, MD  Admit date: 05/16/2021 Discharge date: 05/17/2021  Admitted From: Home  Disposition:  Home   Recommendations for Outpatient Follow-up:  Follow up with PCP in 2 weeks Linda Booker: Please check Hgb at follow up      Home Health: None  Equipment/Devices: None new  Discharge Condition: Good  CODE STATUS: FULL Diet recommendation: Regular  Brief/Interim Summary: Linda Booker is a 75 y.o. F with DM, HTN, CKD iiia who presented with COVID, malaise.  Tested positive for COVID day before admission.  Had typical COVID symptoms of malaise, chills, diarrhea, fatigue and anorexia.  Was prescribed Paxlovid, but came to the ER instead.  In the ER, glucose 40, Cr 1.7 from baseline 1.3        PRINCIPAL HOSPITAL DIAGNOSIS: COVID-19 with hypoglycemia    Discharge Diagnoses:   COVID CXR clear and no evidence of respiratory disease.  Symptom onset within the last 2 days  Start Paxlovid, stop Molnupiravir.    AKI Due to poor oral intake.  Cr 1.7 on admission, resolved down to 1.5 today, baseline 1.3.  Hypoglycemia Due to poor oral intake.  Resolved.  Appetite improved.  Will cut back on her insulin, conitnue her Januvia.  Hypertension  Anemia Follow up with PCP  Hyponatremia  Na 134 yesterday, mild, asymptomatic           Discharge Instructions  Discharge Instructions     Discharge instructions   Complete by: As directed    From Dr. Loleta Booker: You were admitted for COVID.  Thankfully, your symptoms were mild.  Give your age and health history, I recommend you take Paxlovid.  Take Paxlovid 150-100 twice daily It is a five day course and you got two doses here, so you only need four more days The treatment pack includes 2 tabs per dose (1 nirmatrelvir and 1 ritonavir pill), so you will take 2 pills twice daily for 8 more doses then stop.  There  should be 2 doses left at the end. The pharmacist at [facility] will explain.  First dose tonight.  You should isolate for 10 days from the start of your symptoms Go see Linda Booker after that    For your home medicines, you can continue these, but as we discussed, you should HOLD (don't take) your Amaryl/glimepiride if you aren't eating, and cut back on your insulin It's safer for your sugars to run high in the 200s than for it to run low  Drink plenty of fluids   If you have the Lagevrio (molnupiravir) at home, discard it.  If you haven't picked it up, don't.  It is not necessary if you take Paxlovid.   Increase activity slowly   Complete by: As directed       Allergies as of 05/17/2021   No Known Allergies      Medication List     STOP taking these medications    Lagevrio 200 MG Caps Generic drug: Molnupiravir       TAKE these medications    alendronate 70 MG tablet Commonly known as: FOSAMAX Take 70 mg by mouth every Wednesday.   amLODipine 10 MG tablet Commonly known as: NORVASC Take 10 mg by mouth daily.   glimepiride 4 MG tablet Commonly known as: AMARYL Take 4 mg by mouth daily with breakfast.   irbesartan 150 MG tablet Commonly known as: AVAPRO Take 150 mg by mouth daily.  metoprolol succinate 25 MG 24 hr tablet Commonly known as: TOPROL-XL Take 25 mg by mouth daily.   NovoLOG Mix 70/30 FlexPen (70-30) 100 UNIT/ML FlexPen Generic drug: insulin aspart protamine - aspart Inject 25-26 Units into the skin 2 (two) times daily. 25 units with breakfast and 26 units with supper   Paxlovid (300/100) 20 x 150 MG & 10 x '100MG'$  Tbpk Generic drug: nirmatrelvir & ritonavir Take twice daily for 4 days to complete 5 day course.   rosuvastatin 10 MG tablet Commonly known as: CRESTOR Take 10 mg by mouth every evening.   sitaGLIPtin 100 MG tablet Commonly known as: JANUVIA Take 100 mg by mouth daily.        Follow-up Information     Linda Baton, MD.  Schedule an appointment as soon as possible for a visit in 2 week(s).   Specialty: Internal Medicine Contact information: [address] [place] [address] [phone]                No Known Allergies    Procedures/Studies: [facility] Chest Port 1 View  Result Date: 05/16/2021 CLINICAL DATA:  COVID-19 positive.  Weakness. EXAM: PORTABLE CHEST 1 VIEW COMPARISON:  None. FINDINGS: The heart size and mediastinal contours are within normal limits. Both lungs are clear. The visualized skeletal structures are unremarkable. IMPRESSION: No active disease. Electronically Signed   By: Dorise Bullion III M.D.   On: 05/16/2021 17:45      Subjective: No fever, confusion, cough.  Has mild rhinorrhea.  Feels well. Appetite good.  Discharge Exam: Vitals:   05/17/21 0534 05/17/21 0957  BP: (!) 153/56 (!) 156/69  Pulse: 78 75  Resp: 20   Temp: 98.5 F (36.9 C)   SpO2: 97%    Vitals:   05/17/21 0124 05/17/21 0534 05/17/21 0542 05/17/21 0957  BP: (!) 124/48 (!) 153/56  (!) 156/69  Pulse: 80 78  75  Resp: 19 20    Temp: 99.6 F (37.6 C) 98.5 F (36.9 C)    TempSrc: Oral Oral    SpO2: 98% 97%    Weight:   69.3 kg   Height:   '5\' 1"'$  (1.549 m)     General: Pt is alert, awake, not in acute distress Cardiovascular: RRR, nl S1-S2, no murmurs appreciated.   No LE edema.   Respiratory: Normal respiratory rate and rhythm.  CTAB without rales or wheezes. Abdominal: Abdomen soft and non-tender.  No distension or HSM.   Neuro/Psych: Strength symmetric in upper and lower extremities.  Judgment and insight appear normal.   The results of significant diagnostics from this hospitalization (including imaging, microbiology, ancillary and laboratory) are listed below for reference.     Microbiology: Recent Results (from the past 240 hour(s))  Resp Panel by RT-PCR (Flu A&B, Covid) Nasopharyngeal Swab     Status: Abnormal   Collection Time: 05/16/21  3:40 PM   Specimen: Nasopharyngeal Swab;  Nasopharyngeal(NP) swabs in vial transport medium  Result Value Ref Range Status   SARS Coronavirus 2 by RT PCR POSITIVE (A) NEGATIVE Final    Comment: RESULT CALLED TO, READ BACK BY AND VERIFIED WITH:  MCNEAL,E @ [id number] 05/16/21 BY STEPHTR (NOTE) SARS-CoV-2 target nucleic acids are DETECTED.  The SARS-CoV-2 RNA is generally detectable in upper respiratory specimens during the acute phase of infection. Positive results are indicative of the presence of the identified virus, but do not rule out bacterial infection or co-infection with other pathogens not detected by the test. Clinical correlation with patient  history and other diagnostic information is necessary to determine patient infection status. The expected result is Negative.  Fact Sheet for Patients: EntrepreneurPulse.com.au  Fact Sheet for Healthcare Providers: IncredibleEmployment.be  This test is not yet approved or cleared by the [place] FDA and  has been authorized for detection and/or diagnosis of SARS-CoV-2 by FDA under an Emergency Use Authorization (EUA).  This EUA will remain in effect (meaning this test can  be used) for the duration of  the COVID-19 declaration under Section 564(b)(1) of the Act, 21 U.S.C. section 360bbb-3(b)(1), unless the authorization is terminated or revoked sooner.     Influenza A by PCR NEGATIVE NEGATIVE Final   Influenza B by PCR NEGATIVE NEGATIVE Final    Comment: (NOTE) The Xpert Xpress SARS-CoV-2/FLU/RSV plus assay is intended as an aid in the diagnosis of influenza from Nasopharyngeal swab specimens and should not be used as a sole basis for treatment. Nasal washings and aspirates are unacceptable for Xpert Xpress SARS-CoV-2/FLU/RSV testing.  Fact Sheet for Patients: EntrepreneurPulse.com.au  Fact Sheet for Healthcare Providers: IncredibleEmployment.be  This test is not yet approved or cleared by the  [place] FDA and has been authorized for detection and/or diagnosis of SARS-CoV-2 by FDA under an Emergency Use Authorization (EUA). This EUA will remain in effect (meaning this test can be used) for the duration of the COVID-19 declaration under Section 564(b)(1) of the Act, 21 U.S.C. section 360bbb-3(b)(1), unless the authorization is terminated or revoked.  Performed at [facility], [address] [place], [place] [address]      Labs: BNP (last 3 results) No results for input(s): BNP in the last 8760 hours. Basic Metabolic Panel: Recent Labs  Lab 05/16/21 1528 05/17/21 0703  NA 134* 136  K 4.3 4.7  CL 106 107  CO2 20* 23  GLUCOSE 42* 142*  BUN 31* 31*  CREATININE 1.71* 1.57*  CALCIUM 9.0 8.9   Liver Function Tests: Recent Labs  Lab 05/16/21 1528 05/17/21 0703  AST 65* 43*  ALT 29 21  ALKPHOS 88 72  BILITOT 0.6 0.5  PROT 7.9 6.1*  ALBUMIN 4.0 3.1*   No results for input(s): LIPASE, AMYLASE in the last 168 hours. No results for input(s): AMMONIA in the last 168 hours. CBC: Recent Labs  Lab 05/16/21 1528 05/17/21 0703  WBC 6.4 5.0  NEUTROABS 4.8 3.0  HGB 11.7* 10.7*  HCT 34.5* 32.0*  MCV 88.9 89.9  PLT 221 199   Cardiac Enzymes: No results for input(s): CKTOTAL, CKMB, CKMBINDEX, TROPONINI in the last 168 hours. BNP: Invalid input(s): POCBNP CBG: Recent Labs  Lab 05/16/21 2051 05/16/21 2231 05/17/21 0732 05/17/21 1000 05/17/21 1115  GLUCAP 139* 71 146* 155* 122*   D-Dimer No results for input(s): DDIMER in the last 72 hours. Hgb A1c No results for input(s): HGBA1C in the last 72 hours. Lipid Profile No results for input(s): CHOL, HDL, LDLCALC, TRIG, CHOLHDL, LDLDIRECT in the last 72 hours. Thyroid function studies No results for input(s): TSH, T4TOTAL, T3FREE, THYROIDAB in the last 72 hours.  Invalid input(s): FREET3 Anemia work up No results for input(s): VITAMINB12, FOLATE, FERRITIN, TIBC, IRON, RETICCTPCT in the last 72  hours. Urinalysis    Component Value Date/Time   COLORURINE YELLOW 05/16/2021 1705   APPEARANCEUR CLEAR 05/16/2021 1705   LABSPEC 1.006 05/16/2021 1705   PHURINE 5.0 05/16/2021 1705   GLUCOSEU 50 (A) 05/16/2021 1705   HGBUR SMALL (A) 05/16/2021 1705   BILIRUBINUR NEGATIVE 05/16/2021 1705   KETONESUR NEGATIVE 05/16/2021  [place] (A) 05/16/2021 1705   NITRITE NEGATIVE 05/16/2021 1705   LEUKOCYTESUR NEGATIVE 05/16/2021 1705   Sepsis Labs Invalid input(s): PROCALCITONIN,  WBC,  LACTICIDVEN Microbiology Recent Results (from the past 240 hour(s))  Resp Panel by RT-PCR (Flu A&B, Covid) Nasopharyngeal Swab     Status: Abnormal   Collection Time: 05/16/21  3:40 PM   Specimen: Nasopharyngeal Swab; Nasopharyngeal(NP) swabs in vial transport medium  Result Value Ref Range Status   SARS Coronavirus 2 by RT PCR POSITIVE (A) NEGATIVE Final    Comment: RESULT CALLED TO, READ BACK BY AND VERIFIED WITH:  MCNEAL,E @ [id number] 05/16/21 BY STEPHTR (NOTE) SARS-CoV-2 target nucleic acids are DETECTED.  The SARS-CoV-2 RNA is generally detectable in upper respiratory specimens during the acute phase of infection. Positive results are indicative of the presence of the identified virus, but do not rule out bacterial infection or co-infection with other pathogens not detected by the test. Clinical correlation with patient history and other diagnostic information is necessary to determine patient infection status. The expected result is Negative.  Fact Sheet for Patients: EntrepreneurPulse.com.au  Fact Sheet for Healthcare Providers: IncredibleEmployment.be  This test is not yet approved or cleared by the [place] FDA and  has been authorized for detection and/or diagnosis of SARS-CoV-2 by FDA under an Emergency Use Authorization (EUA).  This EUA will remain in effect (meaning this test can  be used) for the duration of  the COVID-19 declaration under  Section 564(b)(1) of the Act, 21 U.S.C. section 360bbb-3(b)(1), unless the authorization is terminated or revoked sooner.     Influenza A by PCR NEGATIVE NEGATIVE Final   Influenza B by PCR NEGATIVE NEGATIVE Final    Comment: (NOTE) The Xpert Xpress SARS-CoV-2/FLU/RSV plus assay is intended as an aid in the diagnosis of influenza from Nasopharyngeal swab specimens and should not be used as a sole basis for treatment. Nasal washings and aspirates are unacceptable for Xpert Xpress SARS-CoV-2/FLU/RSV testing.  Fact Sheet for Patients: EntrepreneurPulse.com.au  Fact Sheet for Healthcare Providers: IncredibleEmployment.be  This test is not yet approved or cleared by the [place] FDA and has been authorized for detection and/or diagnosis of SARS-CoV-2 by FDA under an Emergency Use Authorization (EUA). This EUA will remain in effect (meaning this test can be used) for the duration of the COVID-19 declaration under Section 564(b)(1) of the Act, 21 U.S.C. section 360bbb-3(b)(1), unless the authorization is terminated or revoked.  Performed at [facility], [address] [place], [place] [address]      Time coordinating discharge: 25 minutes     30 Day Unplanned Readmission Risk Score    Flowsheet Row ED to Hosp-Admission (Discharged) from 05/16/2021 in [place]  30 Day Unplanned Readmission Risk Score (%) 11.24 Filed at 05/17/2021 0801       This score is the patient's risk of an unplanned readmission within 30 days of being discharged (0 -100%). The score is based on dignosis, age, lab data, medications, orders, and past utilization.   Low:  0-14.9   Medium: 15-21.9   High: 22-29.9   Extreme: 30 and above            SIGNED:   Edwin Dada, MD  [facility] 05/17/2021, 5:51 PM

## 2021-05-17 NOTE — Care Management CC44 (Signed)
Condition Code 44 Documentation Completed  Patient Details  Name: Linda Booker MRN: [id number] Date of Birth: 06/24/1946   Condition Code 44 given:  Yes Patient signature on Condition Code 44 notice:  Yes Documentation of 2 MD's agreement:  Yes Code 44 added to claim:  Yes    Natasha Bence, LCSW 05/17/2021, 11:37 AM

## 2021-05-17 NOTE — Progress Notes (Signed)
Discharge instructions reviewed with patient. Given AVS. Verbalized understanding of instructions, home medications, follow-up information. IV site removed, site within normal limits. Patient left unit in stable condition via wheelchair accompanied by nurse. Discharged home.

## 2021-05-17 NOTE — Plan of Care (Signed)

## 2021-05-17 NOTE — Progress Notes (Signed)
Gave Incentive Spirometer and Flutter to patient and explained usage of both.  Patient did X10 on IS and achieved 1037m.  Left both at bedside for Patient.

## 2021-07-07 ENCOUNTER — Encounter (INDEPENDENT_AMBULATORY_CARE_PROVIDER_SITE_OTHER): Payer: Medicare PPO | Admitting: Ophthalmology

## 2021-07-23 DIAGNOSIS — L249 Irritant contact dermatitis, unspecified cause: Secondary | ICD-10-CM | POA: Diagnosis not present

## 2021-08-05 ENCOUNTER — Encounter (INDEPENDENT_AMBULATORY_CARE_PROVIDER_SITE_OTHER): Payer: Medicare PPO | Admitting: Ophthalmology

## 2021-08-06 ENCOUNTER — Encounter (INDEPENDENT_AMBULATORY_CARE_PROVIDER_SITE_OTHER): Payer: Self-pay | Admitting: Ophthalmology

## 2021-08-06 ENCOUNTER — Other Ambulatory Visit: Payer: Self-pay

## 2021-08-06 ENCOUNTER — Ambulatory Visit (INDEPENDENT_AMBULATORY_CARE_PROVIDER_SITE_OTHER): Payer: Medicare PPO | Admitting: Ophthalmology

## 2021-08-06 DIAGNOSIS — E113511 Type 2 diabetes mellitus with proliferative diabetic retinopathy with macular edema, right eye: Secondary | ICD-10-CM | POA: Diagnosis not present

## 2021-08-06 DIAGNOSIS — D3131 Benign neoplasm of right choroid: Secondary | ICD-10-CM | POA: Diagnosis not present

## 2021-08-06 DIAGNOSIS — H4312 Vitreous hemorrhage, left eye: Secondary | ICD-10-CM

## 2021-08-06 DIAGNOSIS — E113412 Type 2 diabetes mellitus with severe nonproliferative diabetic retinopathy with macular edema, left eye: Secondary | ICD-10-CM

## 2021-08-06 DIAGNOSIS — H43812 Vitreous degeneration, left eye: Secondary | ICD-10-CM | POA: Insufficient documentation

## 2021-08-06 NOTE — Assessment & Plan Note (Signed)
OS physiologic no change

## 2021-08-06 NOTE — Assessment & Plan Note (Signed)
Stable no change over time 

## 2021-08-06 NOTE — Progress Notes (Signed)
08/06/2021     CHIEF COMPLAINT Patient presents for  Chief Complaint  Patient presents with   Retina Follow Up      HISTORY OF PRESENT ILLNESS: Linda Booker is a 75 y.o. female who presents to the clinic today for:   HPI     Retina Follow Up   Patient presents with  Diabetic Retinopathy.  In both eyes.  This started 5 months ago.  Duration of 5 months.  Since onset it is stable.        Comments   5 month f/u OU with OCT      Last edited by Reather Littler, COA on 08/06/2021  8:41 AM.      Referring physician: Shon Baton, MD [place],  [place] [address]  HISTORICAL INFORMATION:   Selected notes from the [facility]: No current outpatient medications on file. (Ophthalmic Drugs)   No current facility-administered medications for this visit. (Ophthalmic Drugs)   Current Outpatient Medications (Other)  Medication Sig   alendronate (FOSAMAX) 70 MG tablet Take 70 mg by mouth every Wednesday.   amLODipine (NORVASC) 10 MG tablet Take 10 mg by mouth daily.   glimepiride (AMARYL) 4 MG tablet Take 4 mg by mouth daily with breakfast.   irbesartan (AVAPRO) 150 MG tablet Take 150 mg by mouth daily.   metoprolol succinate (TOPROL-XL) 25 MG 24 hr tablet Take 25 mg by mouth daily.   nirmatrelvir & ritonavir (PAXLOVID, 300/100,) 20 x 150 MG & 10 x 100MG  TBPK Take twice daily for 4 days to complete 5 day course.   NOVOLOG MIX 70/30 FLEXPEN (70-30) 100 UNIT/ML FlexPen Inject 25-26 Units into the skin 2 (two) times daily. 25 units with breakfast and 26 units with supper   rosuvastatin (CRESTOR) 10 MG tablet Take 10 mg by mouth every evening.   sitaGLIPtin (JANUVIA) 100 MG tablet Take 100 mg by mouth daily.   No current facility-administered medications for this visit. (Other)      REVIEW OF SYSTEMS:    ALLERGIES No Known Allergies  PAST MEDICAL HISTORY Past Medical History:  Diagnosis Date   Diabetes  mellitus, type 2 ([place] [place])    Hypercholesteremia    Hypertension    Past Surgical History:  Procedure Laterality Date   ABDOMINAL HYSTERECTOMY     NO PAST SURGERIES      FAMILY HISTORY Family History  Problem Relation Age of Onset   Colon cancer Father    Esophageal cancer Neg Hx    Stomach cancer Neg Hx    Rectal cancer Neg Hx     SOCIAL HISTORY Social History   Tobacco Use   Smoking status: Never   Smokeless tobacco: Never  Vaping Use   Vaping Use: Never used  Substance Use Topics   Alcohol use: No   Drug use: No         OPHTHALMIC EXAM:  Base Eye Exam     Visual Acuity (ETDRS)       Right Left   Dist [place] 20/20 -1 20/20 -2         Tonometry (Tonopen, 8:45 AM)       Right Left   Pressure 11 12         Pupils       Pupils Dark Light Shape React APD   Right PERRL 3 2 Round Brisk None   Left PERRL 3 2 Round Brisk None  Visual Fields (Counting fingers)       Left Right    Full Full         Extraocular Movement       Right Left    Full, Ortho Full, Ortho         Neuro/Psych     Oriented x3: Yes   Mood/Affect: Normal         Dilation     Both eyes: 1.0% Mydriacyl, 2.5% Phenylephrine @ 8:45 AM           Slit Lamp and Fundus Exam     External Exam       Right Left   External Normal Normal         Slit Lamp Exam       Right Left   Lids/Lashes Normal Normal   Conjunctiva/Sclera White and quiet White and quiet   Cornea Clear Clear   Anterior Chamber Deep and quiet Deep and quiet   Iris Round and reactive Round and reactive   Lens Centered posterior chamber intraocular lens Centered posterior chamber intraocular lens   Anterior Vitreous Normal Normal         Fundus Exam       Right Left   Posterior Vitreous Clear, Avitric Normal   Disc Normal Normal   C/D Ratio 0.3 0.25   Macula Microaneurysms, Exudates temp and sup, Mild clinically significant macular edema, not center involved Microaneurysms, no  clinically significant macular edema   Vessels PDR-quiet NPDR-Severe,    Periphery Choroidal nevus small superior to the optic nerve, 1.0 disc diameter in size, flat, regular pigment, no atrophy, and no lipofuscin and no red, good PRP 360 Good PRP 360 anterior retina            IMAGING AND PROCEDURES  Imaging and Procedures for 08/06/21  OCT, Retina - OU - Both Eyes       Right Eye Quality was good. Scan locations included subfoveal. Central Foveal Thickness: 290. Progression has worsened. Findings include abnormal foveal contour.   Left Eye Quality was good. Scan locations included subfoveal. Central Foveal Thickness: 283. Progression has been stable. Findings include normal observations.   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 Controlled type 2 diabetes mellitus with left eye affected by severe nonproliferative retinopathy and macular edema, unspecified whether long term insulin use (HCC)  N82.9562 OCT, Retina - OU - Both Eyes    2. 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 Vitreous hemorrhage of left eye ([place])  H43.12     5. Posterior vitreous detachment of left eye  H43.812       1.  OD with no active NVE, quiescent PDR  2.  OS with severe NPDR, with good peripheral anterior PRP thus less likely  to  progress to PDR  3.  OD with minor CSME not center involved we will continue to monitor the exudative areas and deliver focal treatment of these areas worsen next visit  Ophthalmic Meds Ordered this visit:  No orders of the defined types were placed in this encounter.      No follow-ups on file.  There are no Patient Instructions on file for this visit.   Explained the diagnoses, plan, and follow up with the patient and they expressed understanding.  Patient expressed understanding of the importance of proper follow up care.   Clent Demark Adena Sima M.D. Diseases & Surgery of the Retina and Vitreous Retina & Diabetic [place] 08/06/21     Abbreviations: M myopia (nearsighted); A astigmatism; H hyperopia (farsighted); P presbyopia; Mrx spectacle prescription;  CTL contact lenses; OD right eye; OS left eye; OU both eyes  XT exotropia; ET esotropia; PEK punctate epithelial keratitis; PEE punctate epithelial erosions; DES dry eye syndrome; MGD meibomian gland dysfunction; ATs artificial tears; PFAT's preservative free artificial tears; [place] nuclear sclerotic cataract; PSC posterior subcapsular cataract; ERM epi-retinal membrane; PVD posterior vitreous detachment; RD retinal detachment; DM diabetes mellitus; DR diabetic retinopathy; NPDR non-proliferative diabetic retinopathy; PDR proliferative diabetic retinopathy; CSME clinically significant macular edema; DME diabetic macular edema; dbh dot blot hemorrhages; CWS cotton wool spot; POAG primary open angle glaucoma; C/D cup-to-disc ratio; HVF humphrey visual field; GVF goldmann visual field; OCT optical coherence tomography; IOP intraocular pressure; BRVO Branch retinal vein occlusion; CRVO central retinal vein occlusion; CRAO central retinal artery occlusion; BRAO branch retinal artery occlusion; RT retinal tear; SB scleral buckle; PPV pars plana vitrectomy; VH Vitreous hemorrhage; PRP panretinal laser photocoagulation; IVK intravitreal kenalog; VMT  vitreomacular traction; MH Macular hole;  NVD neovascularization of the disc; NVE neovascularization elsewhere; AREDS age related eye disease study; ARMD age related macular degeneration; POAG primary open angle glaucoma; EBMD epithelial/anterior basement membrane dystrophy; ACIOL anterior chamber intraocular lens; IOL intraocular lens; PCIOL posterior chamber intraocular lens; Phaco/IOL phacoemulsification with intraocular lens placement; [place] photorefractive keratectomy; LASIK laser assisted in situ keratomileusis; HTN hypertension; DM diabetes mellitus; COPD chronic obstructive pulmonary disease

## 2021-08-06 NOTE — Assessment & Plan Note (Signed)
OS with good peripheral PRP and no progression

## 2021-08-06 NOTE — Assessment & Plan Note (Signed)
Good PRP OD, no active NVE, room for laser superiorly noted

## 2021-09-10 DIAGNOSIS — E1129 Type 2 diabetes mellitus with other diabetic kidney complication: Secondary | ICD-10-CM | POA: Diagnosis not present

## 2021-09-10 DIAGNOSIS — E663 Overweight: Secondary | ICD-10-CM | POA: Diagnosis not present

## 2021-09-10 DIAGNOSIS — E11319 Type 2 diabetes mellitus with unspecified diabetic retinopathy without macular edema: Secondary | ICD-10-CM | POA: Diagnosis not present

## 2021-09-10 DIAGNOSIS — M8589 Other specified disorders of bone density and structure, multiple sites: Secondary | ICD-10-CM | POA: Diagnosis not present

## 2021-09-10 DIAGNOSIS — R809 Proteinuria, unspecified: Secondary | ICD-10-CM | POA: Diagnosis not present

## 2021-09-10 DIAGNOSIS — D649 Anemia, unspecified: Secondary | ICD-10-CM | POA: Diagnosis not present

## 2021-09-10 DIAGNOSIS — I129 Hypertensive chronic kidney disease with stage 1 through stage 4 chronic kidney disease, or unspecified chronic kidney disease: Secondary | ICD-10-CM | POA: Diagnosis not present

## 2021-09-10 DIAGNOSIS — N184 Chronic kidney disease, stage 4 (severe): Secondary | ICD-10-CM | POA: Diagnosis not present

## 2021-09-10 DIAGNOSIS — E785 Hyperlipidemia, unspecified: Secondary | ICD-10-CM | POA: Diagnosis not present

## 2021-09-10 DIAGNOSIS — Z794 Long term (current) use of insulin: Secondary | ICD-10-CM | POA: Diagnosis not present

## 2021-09-16 ENCOUNTER — Other Ambulatory Visit: Payer: Self-pay | Admitting: Internal Medicine

## 2021-09-16 DIAGNOSIS — R7989 Other specified abnormal findings of blood chemistry: Secondary | ICD-10-CM

## 2021-09-17 DIAGNOSIS — E785 Hyperlipidemia, unspecified: Secondary | ICD-10-CM | POA: Diagnosis not present

## 2021-09-17 DIAGNOSIS — E11319 Type 2 diabetes mellitus with unspecified diabetic retinopathy without macular edema: Secondary | ICD-10-CM | POA: Diagnosis not present

## 2021-09-30 DIAGNOSIS — E785 Hyperlipidemia, unspecified: Secondary | ICD-10-CM | POA: Diagnosis not present

## 2021-09-30 DIAGNOSIS — I10 Essential (primary) hypertension: Secondary | ICD-10-CM | POA: Diagnosis not present

## 2021-10-16 ENCOUNTER — Ambulatory Visit
Admission: RE | Admit: 2021-10-16 | Discharge: 2021-10-16 | Disposition: A | Discharge status: Home / Self Care | Payer: Medicare PPO | Source: Ambulatory Visit | Attending: Internal Medicine | Admitting: Internal Medicine

## 2021-10-16 DIAGNOSIS — R7989 Other specified abnormal findings of blood chemistry: Secondary | ICD-10-CM

## 2021-10-16 DIAGNOSIS — K8020 Calculus of gallbladder without cholecystitis without obstruction: Secondary | ICD-10-CM | POA: Diagnosis not present

## 2021-10-16 DIAGNOSIS — R944 Abnormal results of kidney function studies: Secondary | ICD-10-CM | POA: Diagnosis not present

## 2021-10-21 ENCOUNTER — Other Ambulatory Visit (HOSPITAL_COMMUNITY): Payer: Self-pay | Admitting: Internal Medicine

## 2021-10-21 DIAGNOSIS — K8050 Calculus of bile duct without cholangitis or cholecystitis without obstruction: Secondary | ICD-10-CM

## 2021-10-25 IMAGING — DX DG CHEST 1V PORT
1 series · 1 of 1 positions shown · non-contrast
Comparison: None.

CLINICAL DATA: DFHSB-P1 positive.  Weakness.

EXAM:
PORTABLE CHEST 1 VIEW

[chest ap]
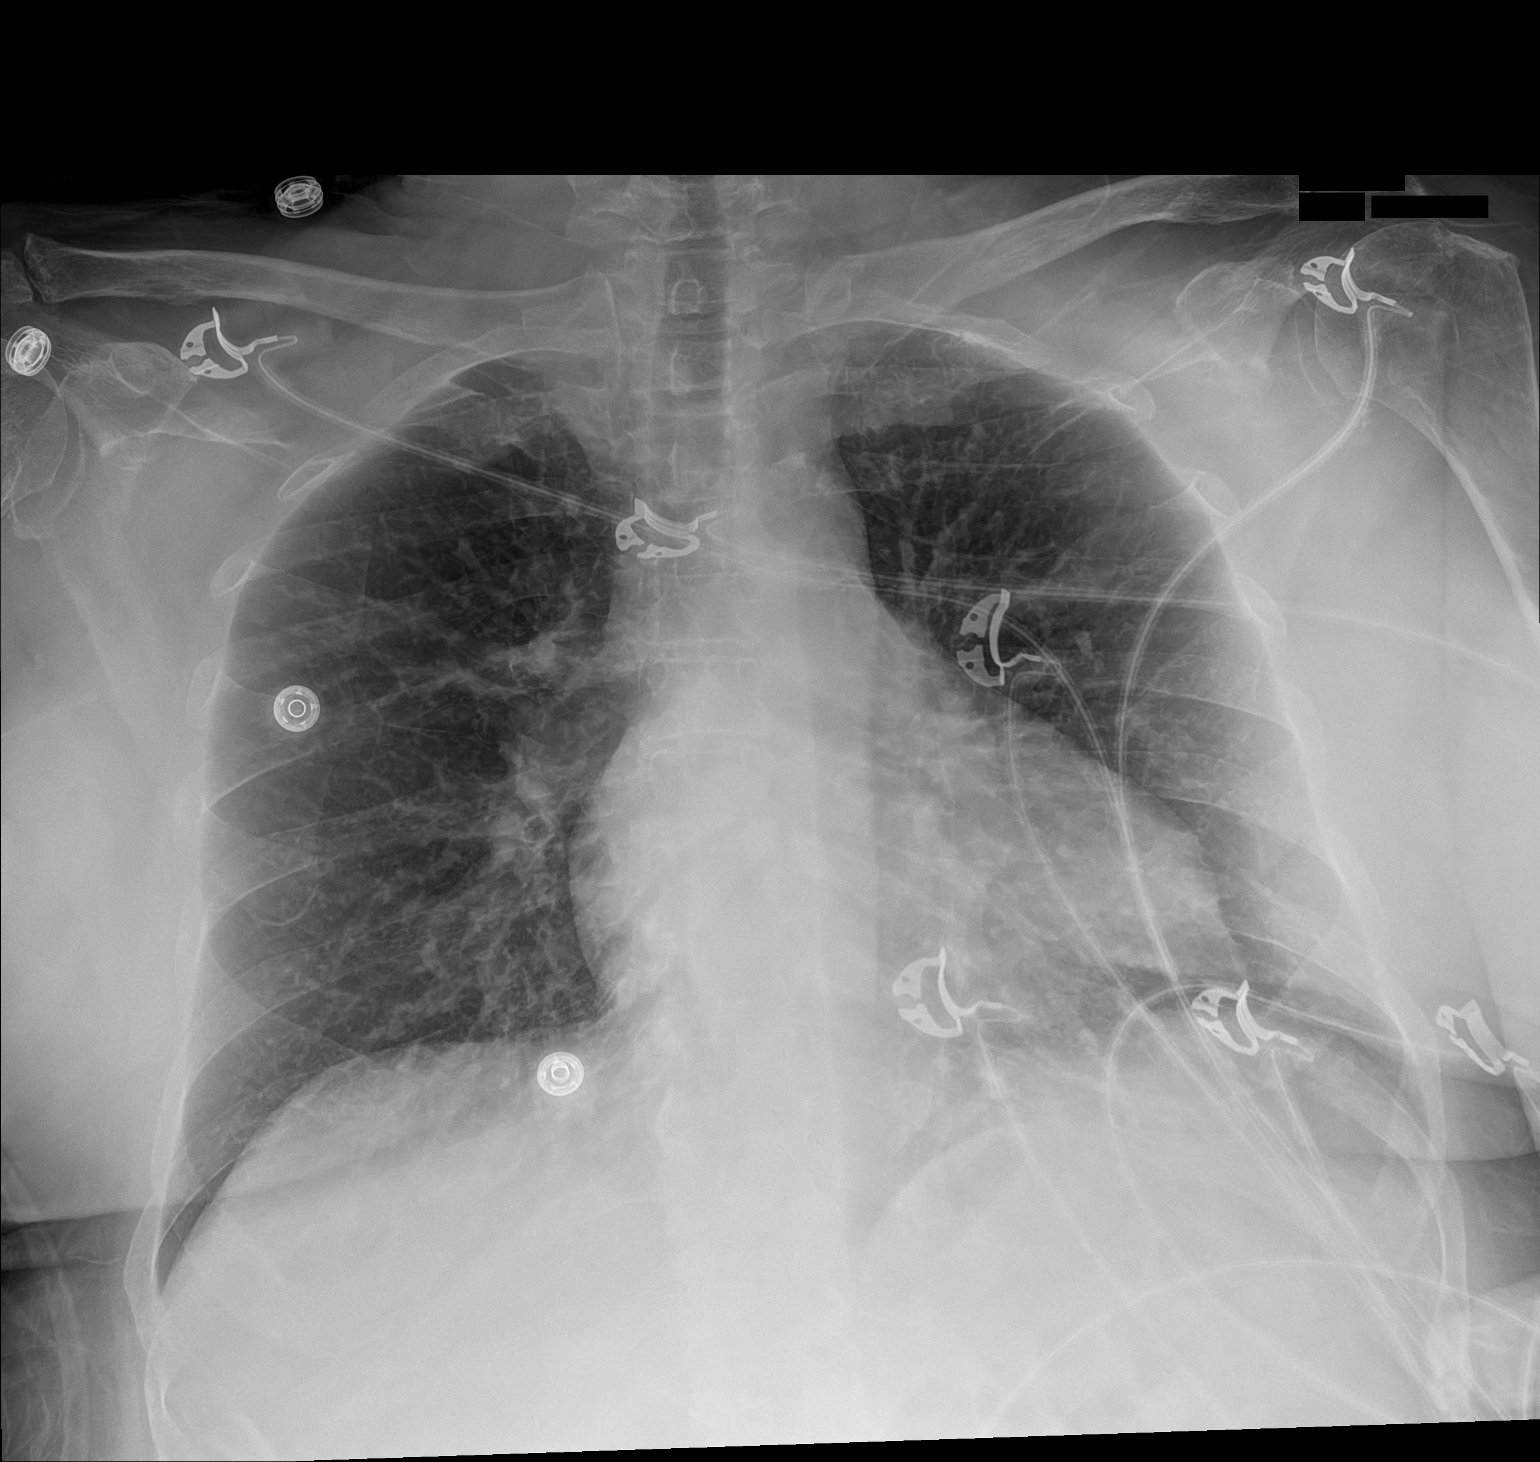

[1 of 1 positions shown; findings below may reference images not displayed]

FINDINGS: The heart size and mediastinal contours are within normal limits.
Both lungs are clear. The visualized skeletal structures are
unremarkable.
IMPRESSION: No active disease.

## 2021-10-26 DIAGNOSIS — R946 Abnormal results of thyroid function studies: Secondary | ICD-10-CM | POA: Diagnosis not present

## 2021-10-26 DIAGNOSIS — I129 Hypertensive chronic kidney disease with stage 1 through stage 4 chronic kidney disease, or unspecified chronic kidney disease: Secondary | ICD-10-CM | POA: Diagnosis not present

## 2021-10-26 DIAGNOSIS — E1129 Type 2 diabetes mellitus with other diabetic kidney complication: Secondary | ICD-10-CM | POA: Diagnosis not present

## 2021-10-26 DIAGNOSIS — E11319 Type 2 diabetes mellitus with unspecified diabetic retinopathy without macular edema: Secondary | ICD-10-CM | POA: Diagnosis not present

## 2021-10-26 DIAGNOSIS — E785 Hyperlipidemia, unspecified: Secondary | ICD-10-CM | POA: Diagnosis not present

## 2021-10-26 DIAGNOSIS — Z794 Long term (current) use of insulin: Secondary | ICD-10-CM | POA: Diagnosis not present

## 2021-10-26 DIAGNOSIS — K8050 Calculus of bile duct without cholangitis or cholecystitis without obstruction: Secondary | ICD-10-CM | POA: Diagnosis not present

## 2021-10-26 DIAGNOSIS — D649 Anemia, unspecified: Secondary | ICD-10-CM | POA: Diagnosis not present

## 2021-10-26 DIAGNOSIS — E559 Vitamin D deficiency, unspecified: Secondary | ICD-10-CM | POA: Diagnosis not present

## 2021-10-26 DIAGNOSIS — R809 Proteinuria, unspecified: Secondary | ICD-10-CM | POA: Diagnosis not present

## 2021-10-26 DIAGNOSIS — N184 Chronic kidney disease, stage 4 (severe): Secondary | ICD-10-CM | POA: Diagnosis not present

## 2021-10-26 DIAGNOSIS — M8589 Other specified disorders of bone density and structure, multiple sites: Secondary | ICD-10-CM | POA: Diagnosis not present

## 2021-10-26 DIAGNOSIS — Z23 Encounter for immunization: Secondary | ICD-10-CM | POA: Diagnosis not present

## 2021-11-10 ENCOUNTER — Ambulatory Visit (HOSPITAL_COMMUNITY): Payer: Medicare PPO

## 2021-11-17 DIAGNOSIS — I129 Hypertensive chronic kidney disease with stage 1 through stage 4 chronic kidney disease, or unspecified chronic kidney disease: Secondary | ICD-10-CM | POA: Diagnosis not present

## 2021-11-17 DIAGNOSIS — E785 Hyperlipidemia, unspecified: Secondary | ICD-10-CM | POA: Diagnosis not present

## 2021-11-17 DIAGNOSIS — I10 Essential (primary) hypertension: Secondary | ICD-10-CM | POA: Diagnosis not present

## 2021-11-17 DIAGNOSIS — N184 Chronic kidney disease, stage 4 (severe): Secondary | ICD-10-CM | POA: Diagnosis not present

## 2021-11-23 ENCOUNTER — Encounter (HOSPITAL_COMMUNITY): Payer: Self-pay

## 2021-11-23 ENCOUNTER — Ambulatory Visit (HOSPITAL_COMMUNITY): Payer: Medicare PPO

## 2021-12-07 ENCOUNTER — Ambulatory Visit (INDEPENDENT_AMBULATORY_CARE_PROVIDER_SITE_OTHER): Payer: Medicare PPO | Admitting: Ophthalmology

## 2021-12-07 ENCOUNTER — Encounter (INDEPENDENT_AMBULATORY_CARE_PROVIDER_SITE_OTHER): Payer: Self-pay | Admitting: Ophthalmology

## 2021-12-07 ENCOUNTER — Other Ambulatory Visit: Payer: Self-pay

## 2021-12-07 DIAGNOSIS — H4311 Vitreous hemorrhage, right eye: Secondary | ICD-10-CM

## 2021-12-07 DIAGNOSIS — D3131 Benign neoplasm of right choroid: Secondary | ICD-10-CM | POA: Diagnosis not present

## 2021-12-07 DIAGNOSIS — E113511 Type 2 diabetes mellitus with proliferative diabetic retinopathy with macular edema, right eye: Secondary | ICD-10-CM

## 2021-12-07 DIAGNOSIS — E113412 Type 2 diabetes mellitus with severe nonproliferative diabetic retinopathy with macular edema, left eye: Secondary | ICD-10-CM | POA: Diagnosis not present

## 2021-12-07 NOTE — Assessment & Plan Note (Signed)
No progression of severe NPDR OS, prophylactic peripheral anterior PRP likely to prevent progression to PDR ?

## 2021-12-07 NOTE — Progress Notes (Signed)
12/07/2021     CHIEF COMPLAINT Patient presents for  Chief Complaint  Patient presents with   Diabetic Retinopathy with Macular Edema      HISTORY OF PRESENT ILLNESS: Linda Booker is a 76 y.o. female who presents to the clinic today for:   HPI   4 mos for dilate OU Pt states no changes in vision.  Pt denies floaters and FOL.  Last edited by Angeline Slim on 12/07/2021  9:12 AM.      Referring physician: Creola Corn, MD [address] [place],  [place] [address]  HISTORICAL INFORMATION:   Selected notes from the MEDICAL RECORD NUMBER       CURRENT MEDICATIONS: No current outpatient medications on file. (Ophthalmic Drugs)   No current facility-administered medications for this visit. (Ophthalmic Drugs)   Current Outpatient Medications (Other)  Medication Sig   alendronate (FOSAMAX) 70 MG tablet Take 70 mg by mouth every Wednesday.   amLODipine (NORVASC) 10 MG tablet Take 10 mg by mouth daily.   glimepiride (AMARYL) 4 MG tablet Take 4 mg by mouth daily with breakfast.   irbesartan (AVAPRO) 150 MG tablet Take 150 mg by mouth daily.   metoprolol succinate (TOPROL-XL) 25 MG 24 hr tablet Take 25 mg by mouth daily.   nirmatrelvir & ritonavir (PAXLOVID, 300/100,) 20 x 150 MG & 10 x 100MG  TBPK Take twice daily for 4 days to complete 5 day course.   NOVOLOG MIX 70/30 FLEXPEN (70-30) 100 UNIT/ML FlexPen Inject 25-26 Units into the skin 2 (two) times daily. 25 units with breakfast and 26 units with supper   rosuvastatin (CRESTOR) 10 MG tablet Take 10 mg by mouth every evening.   sitaGLIPtin (JANUVIA) 100 MG tablet Take 100 mg by mouth daily.   No current facility-administered medications for this visit. (Other)      REVIEW OF SYSTEMS: ROS   Negative for: Constitutional, Gastrointestinal, Neurological, Skin, Genitourinary, Musculoskeletal, HENT, Endocrine, Cardiovascular, Eyes, Respiratory, Psychiatric, Allergic/Imm, Heme/Lymph Last edited by Angeline Slim on 12/07/2021  9:12 AM.        ALLERGIES No Known Allergies  PAST MEDICAL HISTORY Past Medical History:  Diagnosis Date   Diabetes mellitus, type 2 (HCC)    Hypercholesteremia    Hypertension    Past Surgical History:  Procedure Laterality Date   ABDOMINAL HYSTERECTOMY     NO PAST SURGERIES      FAMILY HISTORY Family History  Problem Relation Age of Onset   Colon cancer Father    Esophageal cancer Neg Hx    Stomach cancer Neg Hx    Rectal cancer Neg Hx     SOCIAL HISTORY Social History   Tobacco Use   Smoking status: Never   Smokeless tobacco: Never  Vaping Use   Vaping Use: Never used  Substance Use Topics   Alcohol use: No   Drug use: No         OPHTHALMIC EXAM:  Base Eye Exam     Visual Acuity (ETDRS)       Right Left   Dist [place] 20/20 -1 20/20         Tonometry (Tonopen, 9:17 AM)       Right Left   Pressure 14 13         Pupils       Pupils   Right PERRL   Left PERRL         Visual Fields       Left Right    Full Full  Neuro/Psych     Oriented x3: Yes   Mood/Affect: Normal         Dilation     Both eyes: 1.0% Mydriacyl, 2.5% Phenylephrine @ 9:17 AM           Slit Lamp and Fundus Exam     External Exam       Right Left   External Normal Normal         Slit Lamp Exam       Right Left   Lids/Lashes Normal Normal   Conjunctiva/Sclera White and quiet White and quiet   Cornea Clear Clear   Anterior Chamber Deep and quiet Deep and quiet   Iris Round and reactive Round and reactive   Lens Centered posterior chamber intraocular lens Centered posterior chamber intraocular lens   Anterior Vitreous Normal Normal         Fundus Exam       Right Left   Posterior Vitreous Clear, Avitric Normal   Disc Normal Normal   C/D Ratio 0.3 0.25   Macula Microaneurysms, Exudates temp and sup, Mild clinically significant macular edema, not center involved Microaneurysms, no clinically significant macular edema   Vessels PDR-quiet  NPDR-Severe,    Periphery Choroidal nevus small superior to the optic nerve, 1.0 disc diameter in size, flat, regular pigment, no atrophy, and no lipofuscin and no red, good PRP 360 Good PRP 360 anterior retina no active disease posteriorly            IMAGING AND PROCEDURES  Imaging and Procedures for 12/07/21  OCT, Retina - OU - Both Eyes       Right Eye Quality was good. Scan locations included subfoveal. Central Foveal Thickness: 286. Progression has worsened. Findings include abnormal foveal contour.   Left Eye Quality was good. Scan locations included subfoveal. Central Foveal Thickness: 280. Progression has been stable. Findings include normal observations.   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 The patient was advised to maintain good glucose, blood pressure, monitor kidney function and serum lipid control as advised by personal physician. Rare risk for reactivation of progression exist with untreated severe anemia, untreated renal failure, untreated heart failure, and smoking. Complete avoidance of smoking was recommended. The chance of recurrent proliferative diabetic retinopathy was discussed as well as the chance of vitreous hemorrhage for which further treatments may be necessary.   Explained to the patient that the quiescent   proliferative diabetic retinopathy disease is unlikely to ever worsen.  Worsening factors would include however severe anemia, hypertension out-of-control or impending renal failure.      ICD-10-CM   1. 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 Vitreous hemorrhage of right eye (HCC)  H43.11     3. Controlled type 2 diabetes mellitus with left  eye affected by severe nonproliferative retinopathy and macular edema, unspecified whether long term insulin use (HCC)  Y86.5784     4. Benign neoplasm of choroid, right  D31.31       1.  OD quiescent PDR, no active CSME will continue to monitor and observe the macular region  2.  OS with severe NPDR stabilized with peripheral anterior PRP, will continue to monitor observe  3.  Ophthalmic Meds Ordered this visit:  No orders of the defined types were placed in this encounter.      Return in about 8 months (around 08/09/2022) for DILATE OU, COLOR FP, OCT.  There are no Patient Instructions on file for this visit.   Explained the diagnoses, plan, and follow up with the patient and they expressed understanding.  Patient expressed understanding of the importance of proper follow up care.   Alford Highland Hortencia Martire M.D. Diseases & Surgery of the Retina and Vitreous [facility] 12/07/21     Abbreviations: M myopia (nearsighted); A astigmatism; H hyperopia (farsighted); P presbyopia; Mrx spectacle prescription;  CTL contact lenses; OD right eye; OS left eye; OU both eyes  XT exotropia; ET esotropia; PEK punctate epithelial keratitis; PEE punctate epithelial erosions; DES dry eye syndrome; MGD meibomian gland dysfunction; ATs artificial tears; PFAT's preservative free artificial tears; NSC nuclear sclerotic cataract; PSC posterior subcapsular cataract; ERM epi-retinal membrane; PVD posterior vitreous detachment; RD retinal  detachment; DM diabetes mellitus; DR diabetic retinopathy; NPDR non-proliferative diabetic retinopathy; PDR proliferative diabetic retinopathy; CSME clinically significant macular edema; DME diabetic macular edema; dbh dot blot hemorrhages; CWS cotton wool spot; POAG primary open angle glaucoma; C/D cup-to-disc ratio; HVF humphrey visual field; GVF goldmann visual field; OCT optical coherence tomography; IOP intraocular pressure; BRVO Branch retinal vein occlusion; CRVO central retinal vein occlusion; CRAO central retinal artery occlusion; BRAO branch retinal artery occlusion; RT retinal tear; SB scleral buckle; PPV pars plana vitrectomy; VH Vitreous hemorrhage; PRP panretinal laser photocoagulation; IVK intravitreal kenalog; VMT vitreomacular traction; MH Macular hole;  NVD neovascularization of the disc; NVE neovascularization elsewhere; AREDS age related eye disease study; ARMD age related macular degeneration; POAG primary open angle glaucoma; EBMD epithelial/anterior basement membrane dystrophy; ACIOL anterior chamber intraocular lens; IOL intraocular lens; PCIOL posterior chamber intraocular lens; Phaco/IOL phacoemulsification with intraocular lens placement; PRK photorefractive keratectomy; LASIK laser assisted in situ keratomileusis; HTN hypertension; DM diabetes mellitus; COPD chronic obstructive pulmonary disease

## 2021-12-07 NOTE — Assessment & Plan Note (Signed)

## 2021-12-07 NOTE — Assessment & Plan Note (Signed)
OD, superonasal to nerve, small flat no high risk features ?

## 2021-12-07 NOTE — Assessment & Plan Note (Signed)
Condition resolved post vitrectomy 

## 2021-12-18 DIAGNOSIS — N184 Chronic kidney disease, stage 4 (severe): Secondary | ICD-10-CM | POA: Diagnosis not present

## 2021-12-18 DIAGNOSIS — I129 Hypertensive chronic kidney disease with stage 1 through stage 4 chronic kidney disease, or unspecified chronic kidney disease: Secondary | ICD-10-CM | POA: Diagnosis not present

## 2021-12-18 DIAGNOSIS — I10 Essential (primary) hypertension: Secondary | ICD-10-CM | POA: Diagnosis not present

## 2021-12-18 DIAGNOSIS — E785 Hyperlipidemia, unspecified: Secondary | ICD-10-CM | POA: Diagnosis not present

## 2022-01-17 DIAGNOSIS — I129 Hypertensive chronic kidney disease with stage 1 through stage 4 chronic kidney disease, or unspecified chronic kidney disease: Secondary | ICD-10-CM | POA: Diagnosis not present

## 2022-01-17 DIAGNOSIS — E785 Hyperlipidemia, unspecified: Secondary | ICD-10-CM | POA: Diagnosis not present

## 2022-01-17 DIAGNOSIS — I10 Essential (primary) hypertension: Secondary | ICD-10-CM | POA: Diagnosis not present

## 2022-02-17 DIAGNOSIS — E11319 Type 2 diabetes mellitus with unspecified diabetic retinopathy without macular edema: Secondary | ICD-10-CM | POA: Diagnosis not present

## 2022-02-17 DIAGNOSIS — I129 Hypertensive chronic kidney disease with stage 1 through stage 4 chronic kidney disease, or unspecified chronic kidney disease: Secondary | ICD-10-CM | POA: Diagnosis not present

## 2022-02-17 DIAGNOSIS — E785 Hyperlipidemia, unspecified: Secondary | ICD-10-CM | POA: Diagnosis not present

## 2022-03-01 DIAGNOSIS — R7989 Other specified abnormal findings of blood chemistry: Secondary | ICD-10-CM | POA: Diagnosis not present

## 2022-03-01 DIAGNOSIS — E559 Vitamin D deficiency, unspecified: Secondary | ICD-10-CM | POA: Diagnosis not present

## 2022-03-01 DIAGNOSIS — M109 Gout, unspecified: Secondary | ICD-10-CM | POA: Diagnosis not present

## 2022-03-01 DIAGNOSIS — R946 Abnormal results of thyroid function studies: Secondary | ICD-10-CM | POA: Diagnosis not present

## 2022-03-01 DIAGNOSIS — I10 Essential (primary) hypertension: Secondary | ICD-10-CM | POA: Diagnosis not present

## 2022-03-01 DIAGNOSIS — E785 Hyperlipidemia, unspecified: Secondary | ICD-10-CM | POA: Diagnosis not present

## 2022-03-08 DIAGNOSIS — E785 Hyperlipidemia, unspecified: Secondary | ICD-10-CM | POA: Diagnosis not present

## 2022-03-08 DIAGNOSIS — R809 Proteinuria, unspecified: Secondary | ICD-10-CM | POA: Diagnosis not present

## 2022-03-08 DIAGNOSIS — N184 Chronic kidney disease, stage 4 (severe): Secondary | ICD-10-CM | POA: Diagnosis not present

## 2022-03-08 DIAGNOSIS — Z0000 Encounter for general adult medical examination without abnormal findings: Secondary | ICD-10-CM | POA: Diagnosis not present

## 2022-03-08 DIAGNOSIS — E1129 Type 2 diabetes mellitus with other diabetic kidney complication: Secondary | ICD-10-CM | POA: Diagnosis not present

## 2022-03-08 DIAGNOSIS — Z794 Long term (current) use of insulin: Secondary | ICD-10-CM | POA: Diagnosis not present

## 2022-03-08 DIAGNOSIS — I129 Hypertensive chronic kidney disease with stage 1 through stage 4 chronic kidney disease, or unspecified chronic kidney disease: Secondary | ICD-10-CM | POA: Diagnosis not present

## 2022-03-08 DIAGNOSIS — M8589 Other specified disorders of bone density and structure, multiple sites: Secondary | ICD-10-CM | POA: Diagnosis not present

## 2022-03-08 DIAGNOSIS — E11319 Type 2 diabetes mellitus with unspecified diabetic retinopathy without macular edema: Secondary | ICD-10-CM | POA: Diagnosis not present

## 2022-03-19 DIAGNOSIS — N184 Chronic kidney disease, stage 4 (severe): Secondary | ICD-10-CM | POA: Diagnosis not present

## 2022-03-19 DIAGNOSIS — E11319 Type 2 diabetes mellitus with unspecified diabetic retinopathy without macular edema: Secondary | ICD-10-CM | POA: Diagnosis not present

## 2022-03-19 DIAGNOSIS — I129 Hypertensive chronic kidney disease with stage 1 through stage 4 chronic kidney disease, or unspecified chronic kidney disease: Secondary | ICD-10-CM | POA: Diagnosis not present

## 2022-04-13 DIAGNOSIS — E11319 Type 2 diabetes mellitus with unspecified diabetic retinopathy without macular edema: Secondary | ICD-10-CM | POA: Diagnosis not present

## 2022-04-15 DIAGNOSIS — M65341 Trigger finger, right ring finger: Secondary | ICD-10-CM | POA: Diagnosis not present

## 2022-04-15 DIAGNOSIS — M7062 Trochanteric bursitis, left hip: Secondary | ICD-10-CM | POA: Diagnosis not present

## 2022-04-15 DIAGNOSIS — M25552 Pain in left hip: Secondary | ICD-10-CM | POA: Diagnosis not present

## 2022-06-07 DIAGNOSIS — I10 Essential (primary) hypertension: Secondary | ICD-10-CM | POA: Diagnosis not present

## 2022-07-02 DIAGNOSIS — E11319 Type 2 diabetes mellitus with unspecified diabetic retinopathy without macular edema: Secondary | ICD-10-CM | POA: Diagnosis not present

## 2022-07-14 ENCOUNTER — Other Ambulatory Visit: Payer: Self-pay | Admitting: Internal Medicine

## 2022-07-14 DIAGNOSIS — Z1231 Encounter for screening mammogram for malignant neoplasm of breast: Secondary | ICD-10-CM

## 2022-07-19 DIAGNOSIS — E559 Vitamin D deficiency, unspecified: Secondary | ICD-10-CM | POA: Diagnosis not present

## 2022-07-19 DIAGNOSIS — R809 Proteinuria, unspecified: Secondary | ICD-10-CM | POA: Diagnosis not present

## 2022-07-19 DIAGNOSIS — I129 Hypertensive chronic kidney disease with stage 1 through stage 4 chronic kidney disease, or unspecified chronic kidney disease: Secondary | ICD-10-CM | POA: Diagnosis not present

## 2022-07-19 DIAGNOSIS — M8589 Other specified disorders of bone density and structure, multiple sites: Secondary | ICD-10-CM | POA: Diagnosis not present

## 2022-07-19 DIAGNOSIS — N184 Chronic kidney disease, stage 4 (severe): Secondary | ICD-10-CM | POA: Diagnosis not present

## 2022-07-19 DIAGNOSIS — E1129 Type 2 diabetes mellitus with other diabetic kidney complication: Secondary | ICD-10-CM | POA: Diagnosis not present

## 2022-07-19 DIAGNOSIS — E785 Hyperlipidemia, unspecified: Secondary | ICD-10-CM | POA: Diagnosis not present

## 2022-07-19 DIAGNOSIS — Z23 Encounter for immunization: Secondary | ICD-10-CM | POA: Diagnosis not present

## 2022-07-19 DIAGNOSIS — M1990 Unspecified osteoarthritis, unspecified site: Secondary | ICD-10-CM | POA: Diagnosis not present

## 2022-07-19 DIAGNOSIS — Z794 Long term (current) use of insulin: Secondary | ICD-10-CM | POA: Diagnosis not present

## 2022-07-19 DIAGNOSIS — D649 Anemia, unspecified: Secondary | ICD-10-CM | POA: Diagnosis not present

## 2022-07-19 DIAGNOSIS — E11319 Type 2 diabetes mellitus with unspecified diabetic retinopathy without macular edema: Secondary | ICD-10-CM | POA: Diagnosis not present

## 2022-08-09 ENCOUNTER — Encounter (INDEPENDENT_AMBULATORY_CARE_PROVIDER_SITE_OTHER): Payer: Medicare PPO | Admitting: Ophthalmology

## 2022-09-03 ENCOUNTER — Ambulatory Visit
Admission: RE | Admit: 2022-09-03 | Discharge: 2022-09-03 | Disposition: A | Discharge status: Home / Self Care | Payer: Medicare PPO | Source: Ambulatory Visit | Attending: Internal Medicine | Admitting: Internal Medicine

## 2022-09-03 DIAGNOSIS — Z1231 Encounter for screening mammogram for malignant neoplasm of breast: Secondary | ICD-10-CM | POA: Diagnosis not present

## 2022-09-08 DIAGNOSIS — E113553 Type 2 diabetes mellitus with stable proliferative diabetic retinopathy, bilateral: Secondary | ICD-10-CM | POA: Diagnosis not present

## 2022-09-08 DIAGNOSIS — H43812 Vitreous degeneration, left eye: Secondary | ICD-10-CM | POA: Diagnosis not present

## 2022-09-08 DIAGNOSIS — H4311 Vitreous hemorrhage, right eye: Secondary | ICD-10-CM | POA: Diagnosis not present

## 2022-09-21 DIAGNOSIS — E11319 Type 2 diabetes mellitus with unspecified diabetic retinopathy without macular edema: Secondary | ICD-10-CM | POA: Diagnosis not present

## 2022-09-26 ENCOUNTER — Other Ambulatory Visit: Payer: Self-pay

## 2022-09-26 ENCOUNTER — Encounter (HOSPITAL_COMMUNITY): Payer: Self-pay

## 2022-09-26 ENCOUNTER — Emergency Department (HOSPITAL_COMMUNITY)
Admission: EM | Admit: 2022-09-26 | Discharge: 2022-10-21 | Disposition: E | Payer: Medicare PPO | Attending: Emergency Medicine | Admitting: Emergency Medicine

## 2022-09-26 DIAGNOSIS — R0602 Shortness of breath: Secondary | ICD-10-CM | POA: Diagnosis present

## 2022-09-26 DIAGNOSIS — Z79899 Other long term (current) drug therapy: Secondary | ICD-10-CM | POA: Diagnosis not present

## 2022-09-26 DIAGNOSIS — I10 Essential (primary) hypertension: Secondary | ICD-10-CM | POA: Diagnosis not present

## 2022-09-26 DIAGNOSIS — R55 Syncope and collapse: Secondary | ICD-10-CM | POA: Diagnosis not present

## 2022-09-26 DIAGNOSIS — R231 Pallor: Secondary | ICD-10-CM | POA: Diagnosis not present

## 2022-09-26 DIAGNOSIS — I469 Cardiac arrest, cause unspecified: Secondary | ICD-10-CM

## 2022-09-26 DIAGNOSIS — Z7984 Long term (current) use of oral hypoglycemic drugs: Secondary | ICD-10-CM | POA: Insufficient documentation

## 2022-09-26 DIAGNOSIS — Z794 Long term (current) use of insulin: Secondary | ICD-10-CM | POA: Diagnosis not present

## 2022-09-26 DIAGNOSIS — I499 Cardiac arrhythmia, unspecified: Secondary | ICD-10-CM | POA: Diagnosis not present

## 2022-09-26 DIAGNOSIS — R112 Nausea with vomiting, unspecified: Secondary | ICD-10-CM | POA: Diagnosis not present

## 2022-09-26 DIAGNOSIS — R110 Nausea: Secondary | ICD-10-CM | POA: Diagnosis not present

## 2022-09-26 DIAGNOSIS — E119 Type 2 diabetes mellitus without complications: Secondary | ICD-10-CM | POA: Insufficient documentation

## 2022-09-26 MED ORDER — EPINEPHRINE 1 MG/10ML IJ SOSY
PREFILLED_SYRINGE | INTRAMUSCULAR | Status: AC | PRN
  Administered 2022-09-26: 1 mg via INTRAVENOUS

## 2022-10-21 NOTE — ED Provider Notes (Signed)
[facility] EMERGENCY DEPARTMENT Provider Note   CSN: [id number] Arrival date & time: 2022-10-22  1900     History {Add pertinent medical, surgical, social history, OB history to HPI:1} Chief Complaint  Patient presents with   CPR    Linda Booker is a 77 y.o. female.  Patient has a history of hypertension diabetes and elevated cholesterol.  She has had a mild cough for couple days and became really short of breath and she passed out.  EMS was called and when they arrived her pulse ox was 70% and then she went quickly into asystole.  They did CPR for 35 minutes they gave her 6 epi's and she never got out of asystole  The history is provided by the EMS personnel. No language interpreter was used.  Cardiac Arrest Witnessed by:  Family member and healthcare provider Incident location:  Home Time before BLS initiated:  Immediate Time before ALS initiated:  Immediate Condition upon EMS arrival:  Agonal respirations Pulse:  Weak Treatments prior to arrival:  ACLS protocol Medications given prior to ED:  Epinephrine Rhythm on admission to ED:  Unchanged Associated symptoms: difficulty breathing   Associated symptoms: no chest pain        Home Medications Prior to Admission medications   Medication Sig Start Date End Date Taking? Authorizing Provider  alendronate (FOSAMAX) 70 MG tablet Take 70 mg by mouth every Wednesday. 03/30/21   [provider]  amLODipine (NORVASC) 10 MG tablet Take 10 mg by mouth daily. 03/11/21   [provider]  glimepiride (AMARYL) 4 MG tablet Take 4 mg by mouth daily with breakfast.    [provider]  irbesartan (AVAPRO) 150 MG tablet Take 150 mg by mouth daily. 05/12/21   [provider]  metoprolol succinate (TOPROL-XL) 25 MG 24 hr tablet Take 25 mg by mouth daily. 05/12/21   [provider]  nirmatrelvir & ritonavir (PAXLOVID, 300/100,) 20 x 150 MG & 10 x '100MG'$  TBPK Take twice daily for 4 days to complete 5  day course. 05/17/21   Danford, Suann Larry, MD  NOVOLOG MIX 70/30 FLEXPEN (70-30) 100 UNIT/ML FlexPen Inject 25-26 Units into the skin 2 (two) times daily. 25 units with breakfast and 26 units with supper 05/12/21   [provider]  rosuvastatin (CRESTOR) 10 MG tablet Take 10 mg by mouth every evening. 03/11/21   [provider]  sitaGLIPtin (JANUVIA) 100 MG tablet Take 100 mg by mouth daily.    [provider]      Allergies    Patient has no known allergies.    Review of Systems   Review of Systems  Unable to perform ROS: Acuity of condition  Cardiovascular:  Negative for chest pain.    Physical Exam Updated Vital Signs Pulse (!) 0   Resp (!) 0   Ht '5\' 1"'$  (1.549 m)   Wt 70 kg   SpO2 (!) 0%   BMI 29.16 kg/m  Physical Exam Vitals and nursing note reviewed.  Constitutional:      Appearance: She is well-developed. She is ill-appearing.  HENT:     Head: Normocephalic.     Nose: Nose normal.     Mouth/Throat:     Comments: Patient had a King airway in place Eyes:     General: No scleral icterus.    Conjunctiva/sclera: Conjunctivae normal.  Neck:     Thyroid: No thyromegaly.  Cardiovascular:     Heart sounds: No murmur heard.  No friction rub. No gallop.     Comments: Patient had no cardiac PE.  She was getting CPR Pulmonary:     Breath sounds: No stridor. No wheezing or rales.     Comments: Patient with bagged Chest:     Chest wall: No tenderness.  Abdominal:     General: There is no distension.     Tenderness: There is no abdominal tenderness. There is no rebound.  Musculoskeletal:     Cervical back: Neck supple.     Comments: Patient not responding to any verbal or painful stimuli  Lymphadenopathy:     Cervical: No cervical adenopathy.  Skin:    Findings: No erythema or rash.  Neurological:     Motor: No abnormal muscle tone.     Coordination: Coordination normal.     Comments: Patient was getting CPR she had no neurological  function     ED Results / Procedures / Treatments   Labs (all labs ordered are listed, but only abnormal results are displayed) Labs Reviewed - No data to display CRITICAL CARE Performed by: Milton Ferguson Total critical care time: 35 minutes Critical care time was exclusive of separately billable procedures and treating other patients. Critical care was necessary to treat or prevent imminent or life-threatening deterioration. Critical care was time spent personally by me on the following activities: development of treatment plan with patient and/or surrogate as well as nursing, discussions with consultants, evaluation of patient's response to treatment, examination of patient, obtaining history from patient or surrogate, ordering and performing treatments and interventions, ordering and review of laboratory studies, ordering and review of radiographic studies, pulse oximetry and re-evaluation of patient's condition.    Patient continued CPR in the emergency department and was given another epinephrine IV.  She never regained a pulse and stayed in asystole.  Patient was pronounced dead at 7:04 PM I got in touch with her doctors and Dr. Virgina Jock will take care of the patient at significant EKG None  Radiology No results found.  Procedures Procedures  {Document cardiac monitor, telemetry assessment procedure when appropriate:1}  Medications Ordered in ED Medications  EPINEPHrine (ADRENALIN) 1 MG/10ML injection (1 mg Intravenous Given Oct 08, 2022 1902)    ED Course/ Medical Decision Making/ A&P                           Medical Decision Making Risk Prescription drug management.   Cardiopulmonary arrest with patient expiring  {Document critical care time when appropriate:1} {Document review of labs and clinical decision tools ie heart score, Chads2Vasc2 etc:1}  {Document your independent review of radiology images, and any outside records:1} {Document your discussion with family  members, caretakers, and with consultants:1} {Document social determinants of health affecting pt's care:1} {Document your decision making why or why not admission, treatments were needed:1} Final Clinical Impression(s) / ED Diagnoses Final diagnoses:  Cardiopulmonary arrest ([place])    Rx / DC Orders ED Discharge Orders     None

## 2022-10-21 NOTE — ED Triage Notes (Signed)
Rcems from home. Cc of not feeling well. EMS was called out initially for sick call at 1817. When they arrived pt was unresponsive. Per family patient started coughing and then "went out."  CPR at [place]. Pt was in a PEA / asystole rhythm. EMS gave 6 total doses of Epi. When pt arrived pt was still pulseless. Another Epi given at 1902. With no changes. CPR stopped and TOD called at 1904.

## 2022-10-21 DEATH — deceased
